# Patient Record
Sex: Female | Born: 2000 | Race: Black or African American | Hispanic: No | Marital: Single | Smoking: Never smoker
Health system: Southern US, Community
[De-identification: ages and names within clinical notes are randomized; demographics above are authoritative.]

## PROBLEM LIST (undated history)

## (undated) DIAGNOSIS — Z789 Other specified health status: Secondary | ICD-10-CM

## (undated) HISTORY — PX: NO PAST SURGERIES: SHX2092

---

## 2020-01-02 NOTE — L&D Delivery Note (Addendum)
OB/GYN Faculty Practice Delivery Note  Carolyn Rivas is a 20 y.o. G2P0010 s/p SVD at [redacted]w[redacted]d. She was admitted for IOL due to pre-eclampsia with severe features.   ROM: 10h 37m with clear fluid GBS Status: Negative Maximum Maternal Temperature: 98.9  Delivery Date/Time: 11/20/2020 at 2331 Delivery: Called to room and patient was complete and pushing with NICU team in the room. Head delivered without difficulty, LOA. No nuchal cord present. Shoulder and body delivered in usual fashion. Infant with spontaneous cry, placed on mother's abdomen, dried and stimulated. Cord clamped x 2 after 1-minute delay, and cut by father of the baby. Cord blood drawn. Placenta delivered spontaneously with gentle cord traction. Fundus firm with massage and Pitocin. Labia, perineum, vagina, and cervix inspected inspected with no lacerations.   Placenta: Intact,; 3VC- sent to pathology Complications: None Lacerations: None EBL: 150 cc Analgesia: Epidural  Infant: Viable  APGARs 7 & 9  1890g  Darral Dash, D.O. [facility], PGY-1 11/20/2020, 11:47 PM

## 2020-05-17 ENCOUNTER — Other Ambulatory Visit: Payer: Self-pay

## 2020-05-17 ENCOUNTER — Inpatient Hospital Stay (HOSPITAL_COMMUNITY)
Admission: EM | Admit: 2020-05-17 | Discharge: 2020-05-18 | Disposition: A | Discharge status: Home / Self Care | Payer: Medicaid Other | Attending: Obstetrics and Gynecology | Admitting: Obstetrics and Gynecology

## 2020-05-17 ENCOUNTER — Encounter (HOSPITAL_COMMUNITY): Payer: Self-pay | Admitting: Emergency Medicine

## 2020-05-17 DIAGNOSIS — N939 Abnormal uterine and vaginal bleeding, unspecified: Secondary | ICD-10-CM

## 2020-05-17 DIAGNOSIS — Z3A08 8 weeks gestation of pregnancy: Secondary | ICD-10-CM | POA: Insufficient documentation

## 2020-05-17 DIAGNOSIS — O209 Hemorrhage in early pregnancy, unspecified: Secondary | ICD-10-CM | POA: Insufficient documentation

## 2020-05-17 DIAGNOSIS — Z3A01 Less than 8 weeks gestation of pregnancy: Secondary | ICD-10-CM | POA: Insufficient documentation

## 2020-05-17 HISTORY — DX: Other specified health status: Z78.9

## 2020-05-17 NOTE — MAU Note (Signed)
Pt presents to MAU c/o dark red/brown vaginal bleeding beginning today.  Pt denies any lower abdominal pain, states that she is unsure how many pads she has soaked through, but states that the bleeding did soak through the shorts she was wearing.  Pt did not observe any clots.  No recent intercourse.  Pt states that she was diagnosed with mononucleosis infection approx. 3 weeks ago.

## 2020-05-17 NOTE — ED Triage Notes (Signed)
Patient reports heavy vaginal bleeding this evening , she is [redacted] weeks pregnant , no prenatal visit , G2P0, denies abdominal cramping or contractions, seen by PA at triage .

## 2020-05-17 NOTE — MAU Provider Note (Signed)
History     CSN: [id number]  Arrival date and time: 05/17/20 2248   None     Chief Complaint  Patient presents with  . [redacted] weeks pregnant /Vaginal Bleeding   HPI   Patient is a g2p0010 at approx 7-8 wks by LMP who presents for vaginal bleeding. She had a positive b-HCG on 4/23 (HCG was 538 per [facility]). LMP in end of March. Vaginal bleeding started at 8pm tonight. Reports large amounts of bright red bleeding, enough to soak through her shorts. She denies any cramping or large clots associated with bleeding. Denies nausea or any abdominal pain. Reports bleeding has slowed down but is still present. Says it is lighter than a period at this time.    OB History    Gravida  2   Para      Term      Preterm      AB  1   Living        SAB      IAB  1   Ectopic      Multiple      Live Births              Past Medical History:  Diagnosis Date  . Medical history non-contributory     Past Surgical History:  Procedure Laterality Date  . NO PAST SURGERIES      No family history on file.  Social History   Tobacco Use  . Smoking status: Never Smoker  . Smokeless tobacco: Never Used  Substance Use Topics  . Alcohol use: Never  . Drug use: Never    Allergies: No Known Allergies  Medications Prior to Admission  Medication Sig Dispense Refill Last Dose  . acetaminophen (TYLENOL) 325 MG tablet Take 650 mg by mouth every 6 (six) hours as needed.   Past Month at Unknown time    Review of Systems  Constitutional: Negative for activity change, appetite change and chills.  Respiratory: Negative for chest tightness and shortness of breath.   Neurological: Negative for dizziness and facial asymmetry.   Physical Exam   Blood pressure 122/82, pulse (!) 105, temperature 98.9 F (37.2 C), temperature source Oral, resp. rate 16, last menstrual period 03/26/2020, SpO2 100 %.  Physical Exam Vitals and nursing note reviewed.  Constitutional:      Appearance:  Normal appearance.  Cardiovascular:     Rate and Rhythm: Normal rate.  Pulmonary:     Effort: Pulmonary effort is normal.  Abdominal:     General: Bowel sounds are normal.     Palpations: Abdomen is soft.     Tenderness: There is no abdominal tenderness.  Skin:    General: Skin is warm and dry.  Neurological:     General: No focal deficit present.     Mental Status: She is alert.  Psychiatric:        Mood and Affect: Mood normal.        Behavior: Behavior normal.     MAU Course  Procedures  MDM Pt evaluated at bedside  Ectopic workup - CBC, ABO, HCG, [place]  CBC notable for hgb 10.4  US shows viable IUP at [redacted]w[redacted]d, moderate subchorionic hemorrhage Rh Pos, rhogam not indicated  Assessment and Plan   Viable IUP w subchorionic hemorrhage -discussed w patient and discussed etiology of subchorionic hemorrhage -patient lives in [place], [place], plans to find prenatal care in that area.  -discussed pelvic rest until bleeding resolves, counseled on return precautions -script sent  for prenatal vitamin  Gita Kudo 05/17/2020, 11:44 PM

## 2020-05-17 NOTE — ED Provider Notes (Signed)
Emergency Medicine Provider OB Triage Evaluation Note  Carolyn Rivas is a 20 y.o. female, G2P0A1, at Unknown gestation who presents to the emergency department with complaints of vaginal bleeding that began around 9PM  Review of  Systems  Positive: vaginal bleeding Negative: abdominal pain, syncope, chest pain, dyspnea  Physical Exam  BP 122/82 (BP Location: Right Arm)   Pulse (!) 105   Temp 98.9 F (37.2 C) (Oral)   Resp 16   LMP 03/26/2020   SpO2 100%    General: Awake, no distress  HEENT: Atraumatic  Resp: Normal effort  Cardiac: Mild tachycardia Abd: Nondistended, nontender  MSK: Moves all extremities without difficulty Neuro: Speech clear  Medical Decision Making  Pt evaluated for pregnancy concern and is stable for transfer to MAU. Pt is in agreement with plan for transfer.  Chart reviewed- positive pregnancy test in care everywhere with [facility].   10:57 PM Discussed with MAU attending who accepts patient in transfer.  Clinical Impression   1. Bleeding in early pregnancy        Cherly Anderson, PA-C 05/17/20 2130    Rozelle Logan, DO 05/17/20 2337

## 2020-05-18 ENCOUNTER — Inpatient Hospital Stay (HOSPITAL_COMMUNITY): Payer: Medicaid Other

## 2020-05-18 DIAGNOSIS — Z3A01 Less than 8 weeks gestation of pregnancy: Secondary | ICD-10-CM | POA: Diagnosis not present

## 2020-05-18 DIAGNOSIS — O209 Hemorrhage in early pregnancy, unspecified: Secondary | ICD-10-CM

## 2020-05-18 DIAGNOSIS — Z3A08 8 weeks gestation of pregnancy: Secondary | ICD-10-CM | POA: Diagnosis not present

## 2020-05-18 LAB — CBC
HCT: 32.2 % — ABNORMAL LOW (ref 36.0–46.0)
Hemoglobin: 10.4 g/dL — ABNORMAL LOW (ref 12.0–15.0)
MCH: 28.4 pg (ref 26.0–34.0)
MCHC: 32.3 g/dL (ref 30.0–36.0)
MCV: 88 fL (ref 80.0–100.0)
Platelets: 279 10*3/uL (ref 150–400)
RBC: 3.66 MIL/uL — ABNORMAL LOW (ref 3.87–5.11)
RDW: 13.7 % (ref 11.5–15.5)
WBC: 8.2 10*3/uL (ref 4.0–10.5)
nRBC: 0 % (ref 0.0–0.2)

## 2020-05-18 LAB — HCG, QUANTITATIVE, PREGNANCY: hCG, Beta Chain, Quant, S: 48201 m[IU]/mL — ABNORMAL HIGH (ref <5)

## 2020-05-18 LAB — ABO/RH: ABO/RH(D): O POS

## 2020-05-18 MED ORDER — PREPLUS 27-1 MG PO TABS: 1.0000 | ORAL_TABLET | Freq: Every day | ORAL | 13 refills | Status: DC

## 2020-05-18 NOTE — Discharge Instructions (Signed)
Subchorionic Hematoma  A hematoma is a collection of blood outside of the blood vessels. A subchorionic hematoma is a collection of blood between the outer wall of the embryo (chorion) and the inner wall of the uterus. This condition can cause vaginal bleeding. Early small hematomas usually shrink on their own and do not affect your baby or pregnancy. When bleeding starts later in pregnancy, or if the hematoma is larger or occurs in older pregnant women, the condition may be more serious. Larger hematomas increase the chances of miscarriage. This condition also increases the risk of:  Premature separation of the placenta from the uterus.  Premature (preterm) labor.  Stillbirth. What are the causes? The exact cause of this condition is not known. It occurs when blood is trapped between the placenta and the uterine wall because the placenta has separated from the original site of implantation. What increases the risk? You are more likely to develop this condition if:  You were treated with fertility medicines.  You became pregnant through in vitro fertilization (IVF). What are the signs or symptoms? Symptoms of this condition include:  Vaginal spotting or bleeding.  Abdominal pain. This is rare. Sometimes you may have no symptoms and the bleeding may only be seen when ultrasound images are taken (transvaginal ultrasound). How is this diagnosed? This condition is diagnosed based on a physical exam. This includes a pelvic exam. You may also have other tests, including:  Blood tests.  Urine tests.  Ultrasound of the abdomen. How is this treated? Treatment for this condition can vary. Treatment may include:  Watchful waiting. You will be monitored closely for any changes in bleeding.  Medicines.  Activity restriction. This may be needed until the bleeding stops.  A medicine called Rh immunoglobulin. This is given if you have an Rh-negative blood type. It prevents Rh  sensitization. Follow these instructions at home:  Stay on bed rest if told to do so by your health care provider.  Do not lift anything that is heavier than 10 lb (4.5 kg), or the limit that you are told by your health care provider.  Track and write down the number of pads you use each day and how soaked (saturated) they are.  Do not use tampons.  Keep all follow-up visits. This is important. Your health care provider may ask you to have follow-up blood tests or ultrasound tests or both. Contact a health care provider if:  You have any vaginal bleeding.  You have a fever. Get help right away if:  You have severe cramps in your stomach, back, abdomen, or pelvis.  You pass large clots or tissue. Save any tissue for your health care provider to look at.  You faint.  You become light-headed or weak. Summary  A subchorionic hematoma is a collection of blood between the outer wall of the embryo (chorion) and the inner wall of the uterus.  This condition can cause vaginal bleeding.  Sometimes you may have no symptoms and the bleeding may only be seen when ultrasound images are taken.  Treatment may include watchful waiting, medicines, or activity restriction.  Keep all follow-up visits. Get help right away if you have severe cramps or heavy vaginal bleeding. This information is not intended to replace advice given to you by your health care provider. Make sure you discuss any questions you have with your health care provider. Document Revised: 09/14/2019 Document Reviewed: 09/14/2019 Elsevier Patient Education  2021 Elsevier Inc.   

## 2020-06-19 ENCOUNTER — Inpatient Hospital Stay (HOSPITAL_COMMUNITY)
Admission: AD | Admit: 2020-06-19 | Discharge: 2020-06-19 | Disposition: A | Discharge status: Home / Self Care | Payer: Medicaid Other | Attending: Family Medicine | Admitting: Family Medicine

## 2020-06-19 ENCOUNTER — Other Ambulatory Visit: Payer: Self-pay

## 2020-06-19 ENCOUNTER — Encounter (HOSPITAL_COMMUNITY): Payer: Self-pay | Admitting: Family Medicine

## 2020-06-19 DIAGNOSIS — Z3492 Encounter for supervision of normal pregnancy, unspecified, second trimester: Secondary | ICD-10-CM

## 2020-06-19 DIAGNOSIS — Z79899 Other long term (current) drug therapy: Secondary | ICD-10-CM | POA: Insufficient documentation

## 2020-06-19 DIAGNOSIS — Z3A12 12 weeks gestation of pregnancy: Secondary | ICD-10-CM | POA: Insufficient documentation

## 2020-06-19 DIAGNOSIS — O26891 Other specified pregnancy related conditions, first trimester: Secondary | ICD-10-CM | POA: Insufficient documentation

## 2020-06-19 DIAGNOSIS — Z3689 Encounter for other specified antenatal screening: Secondary | ICD-10-CM

## 2020-06-19 DIAGNOSIS — R519 Headache, unspecified: Secondary | ICD-10-CM | POA: Diagnosis not present

## 2020-06-19 LAB — URINALYSIS, ROUTINE W REFLEX MICROSCOPIC
Bilirubin Urine: NEGATIVE
Glucose, UA: NEGATIVE mg/dL
Hgb urine dipstick: NEGATIVE
Ketones, ur: NEGATIVE mg/dL
Leukocytes,Ua: NEGATIVE
Nitrite: NEGATIVE
Protein, ur: NEGATIVE mg/dL
Specific Gravity, Urine: >1.03 — ABNORMAL HIGH (ref 1.005–1.030)
pH: 6 (ref 5.0–8.0)

## 2020-06-19 NOTE — MAU Note (Signed)
Pt is G2P0 at 12.1 weeks. Pt reports she has not received prenatal care and has been unable to get in with a provider. Requesting ultrasound for gender and pregnancy confirmation. 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

## 2020-06-19 NOTE — MAU Provider Note (Signed)
History     CSN: [id number]  Arrival date and time: 06/19/20 1746   Event Date/Time   First Provider Initiated Contact with Patient 06/19/20 1820      Chief Complaint  Patient presents with   Headache   HPI Carolyn Rivas is a 20 y.o. G2P0010 at [redacted]w[redacted]d who presents stating she had a headache at home. She does not have a headache now. She took tylenol and it helped but she was concerned about it causing autism. She denies any pain, vaginal bleeding or discharge. She reports she is here to find out the gender of the baby for her gender reveal next week.   OB History     Gravida  2   Para      Term      Preterm      AB  1   Living         SAB      IAB  1   Ectopic      Multiple      Live Births              Past Medical History:  Diagnosis Date   Medical history non-contributory     Past Surgical History:  Procedure Laterality Date   NO PAST SURGERIES      History reviewed. No pertinent family history.  Social History   Tobacco Use   Smoking status: Never   Smokeless tobacco: Never  Substance Use Topics   Alcohol use: Never   Drug use: Never    Allergies: No Known Allergies  Medications Prior to Admission  Medication Sig Dispense Refill Last Dose   acetaminophen (TYLENOL) 325 MG tablet Take 650 mg by mouth every 6 (six) hours as needed.      Prenatal Vit-Fe Fumarate-FA (PREPLUS) 27-1 MG TABS Take 1 tablet by mouth daily. 30 tablet 13     Review of Systems  Constitutional: Negative.  Negative for fatigue and fever.  HENT: Negative.    Respiratory: Negative.  Negative for shortness of breath.   Cardiovascular: Negative.  Negative for chest pain.  Gastrointestinal: Negative.  Negative for abdominal pain, constipation, diarrhea, nausea and vomiting.  Genitourinary: Negative.  Negative for dysuria, vaginal bleeding and vaginal discharge.  Neurological: Negative.  Negative for dizziness and headaches.  Physical Exam   Blood  pressure 113/62, pulse 96, temperature 98.6 F (37 C), temperature source Oral, resp. rate 16, height 5\' 1"  (1.549 m), weight 53.7 kg, last menstrual period 03/26/2020, SpO2 100 %.  Physical Exam Vitals and nursing note reviewed.  Constitutional:      General: She is not in acute distress.    Appearance: She is well-developed.  HENT:     Head: Normocephalic.  Eyes:     Pupils: Pupils are equal, round, and reactive to light.  Cardiovascular:     Rate and Rhythm: Normal rate and regular rhythm.     Heart sounds: Normal heart sounds.  Pulmonary:     Effort: Pulmonary effort is normal. No respiratory distress.     Breath sounds: Normal breath sounds.  Abdominal:     General: Bowel sounds are normal. There is no distension.     Palpations: Abdomen is soft.     Tenderness: There is no abdominal tenderness.  Skin:    General: Skin is warm and dry.  Neurological:     Mental Status: She is alert and oriented to person, place, and time.  Psychiatric:  Mood and Affect: Mood normal.        Behavior: Behavior normal.        Thought Content: Thought content normal.        Judgment: Judgment normal.   FHT: 160 bpm  MAU Course  Procedures Results for orders placed or performed during the hospital encounter of 06/19/20 (from the past 24 hour(s))  Urinalysis, Routine w reflex microscopic Urine, Clean Catch     Status: Abnormal   Collection Time: 06/19/20  6:07 PM  Result Value Ref Range   Color, Urine YELLOW YELLOW   APPearance CLEAR CLEAR   Specific Gravity, Urine >1.030 (H) 1.005 - 1.030   pH 6.0 5.0 - 8.0   Glucose, UA NEGATIVE NEGATIVE mg/dL   Hgb urine dipstick NEGATIVE NEGATIVE   Bilirubin Urine NEGATIVE NEGATIVE   Ketones, ur NEGATIVE NEGATIVE mg/dL   Protein, ur NEGATIVE NEGATIVE mg/dL   Nitrite NEGATIVE NEGATIVE   Leukocytes,Ua NEGATIVE NEGATIVE    MDM UA  Reassurance of safety of tylenol discussed with patient. Discussed prenatal care and timing of being able to  find out the gender. Discussed importance of proper hydration and nutrition in pregnancy.   Assessment and Plan   1. Presence of fetal heart sounds in second trimester   2. [redacted] weeks gestation of pregnancy    -Discharge home in stable condition -Second trimester precautions discussed -Patient advised to follow-up with OB to establish prenatal care, list given -Patient may return to MAU as needed or if her condition were to change or worsen   Rolm Bookbinder CNM 06/19/2020, 6:20 PM

## 2020-06-19 NOTE — Discharge Instructions (Signed)
Prenatal Care Providers           [facility]  [address] [phone]  [facility]   [address]  [phone]  [facility] [place]       [address] [phone]            [facility] @ [place]     [address] [phone]          [facility] [place]   [address] [phone]  [facility]  [address] [phone]     [facility]  [address]   [phone]     [facility]  Phone: [phone]  [facility]  Phone: [phone]  [facility] Phone: [phone]  [facility] Phone: [phone]  [facility] Phone: [phone]  [facility] Phone: [phone]  [facility]  Phone: [phone] Safe Medications in Pregnancy   Acne: Benzoyl Peroxide Salicylic Acid  Backache/Headache: Tylenol: 2 regular strength every 4 hours OR              2 Extra strength every 6 hours  Colds/Coughs/Allergies: Benadryl (alcohol free) 25 mg every 6 hours as needed Breath right strips Claritin Cepacol throat lozenges Chloraseptic throat spray Cold-Eeze- up to three times per day Cough drops, alcohol free Flonase (by prescription only) Guaifenesin Mucinex Robitussin DM (plain only, alcohol free) Saline nasal spray/drops Sudafed (pseudoephedrine) & Actifed ** use only after [redacted] weeks gestation and if you do not have high blood pressure Tylenol Vicks Vaporub Zinc lozenges Zyrtec   Constipation: Colace Ducolax suppositories Fleet enema Glycerin suppositories Metamucil Milk of magnesia Miralax Senokot Smooth move tea  Diarrhea: Kaopectate Imodium A-D  *NO pepto  Bismol  Hemorrhoids: Anusol Anusol HC Preparation H Tucks  Indigestion: Tums Maalox Mylanta Zantac  Pepcid  Insomnia: Benadryl (alcohol free) 25mg every 6 hours as needed Tylenol PM Unisom, no Gelcaps  Leg Cramps: Tums MagGel  Nausea/Vomiting:  Bonine Dramamine Emetrol Ginger extract Sea bands Meclizine  Nausea medication to take during pregnancy:  Unisom (doxylamine succinate 25 mg tablets) Take one tablet daily at bedtime. If symptoms are not adequately controlled, the dose can be increased to a maximum recommended dose of two tablets daily (1/2 tablet in the morning, 1/2 tablet mid-afternoon and one at bedtime). Vitamin B6 100mg tablets. Take one tablet twice a day (up to 200 mg per day).  Skin Rashes: Aveeno products Benadryl cream or 25mg every 6 hours as needed Calamine Lotion 1% cortisone cream  Yeast infection: Gyne-lotrimin 7 Monistat 7   **If taking multiple medications, please check labels to avoid duplicating the same active ingredients **take medication as directed on the label ** Do not exceed 4000 mg of tylenol in 24 hours **Do not take medications that contain aspirin or ibuprofen    

## 2020-06-29 ENCOUNTER — Ambulatory Visit (INDEPENDENT_AMBULATORY_CARE_PROVIDER_SITE_OTHER): Payer: Medicaid Other | Admitting: *Deleted

## 2020-06-29 ENCOUNTER — Telehealth: Payer: Self-pay | Admitting: *Deleted

## 2020-06-29 ENCOUNTER — Other Ambulatory Visit: Payer: Self-pay

## 2020-06-29 VITALS — BP 100/62 | HR 82 | Temp 99.0°F | Wt 118.8 lb

## 2020-06-29 DIAGNOSIS — Z3480 Encounter for supervision of other normal pregnancy, unspecified trimester: Secondary | ICD-10-CM

## 2020-06-29 MED ORDER — BLOOD PRESSURE MONITOR AUTOMAT DEVI: 1.0000 | Freq: Every day | 0 refills | Status: DC

## 2020-06-29 MED ORDER — GOJJI WEIGHT SCALE MISC: 1.0000 | Freq: Every day | 0 refills | Status: DC | PRN

## 2020-06-29 NOTE — Progress Notes (Signed)
   Location: [facility] Patient: clinic Provider: clinic  PRENATAL INTAKE SUMMARY  Ms. Belknap presents today New OB Nurse Interview.  OB History     Gravida  2   Para      Term      Preterm      AB  1   Living         SAB      IAB  1   Ectopic      Multiple      Live Births             I have reviewed the patient's medical, obstetrical, social, and family histories, medications, and available lab results.  SUBJECTIVE She has no unusual complaints.  OBJECTIVE Initial Nurse interview for history/labs (New OB)  EDD: 12/31/2020 GA: [redacted]w[redacted]d G2P0010 FHT: 159  GENERAL APPEARANCE: alert, well appearing, in no apparent distress, oriented to person, place and time   ASSESSMENT Normal pregnancy Patient was getting labs completed. Lab tech called nurse to come to lab area due to patient "passing out". Patient was taken to room 3, laid on exam table, blood pressures taken and feet elevated about heart level. Patient was given ice pack to place on her check, patient reported being and sweaty. Patient laid on exam table for about 10-15 minutes. Patient reported she felt better and was ready to go.   PLAN Prenatal care:  [facility] Pnl/HIV/Hep C OB Urine Culture GC/CT-next visit with Raelyn Mora, CNM 76/7/22 HgbEval/SMA/CF ([facility]) [facility] A1C Rx blood pressure monitor and weight scale sent to [facility] Patient to sign up for Babyscripts Start PNV  Follow Up Instructions:   I discussed the assessment and treatment plan with the patient. The patient was provided an opportunity to ask questions and all were answered. The patient agreed with the plan and demonstrated an understanding of the instructions.   The patient was advised to call back or seek an in-person evaluation if the symptoms worsen or if the condition fails to improve as anticipated.  I provided 40 minutes of  face-to-face time during this encounter.  Clovis Pu,  RN

## 2020-06-29 NOTE — Telephone Encounter (Signed)
Patient returned call. Appointment changed to this afternoon.  Clovis Pu, RN

## 2020-06-29 NOTE — Telephone Encounter (Signed)
Spoke with patient regarding appointment on 07/05/20. Appointment needs to be rescheduled due to the nurse will be out of the office. Patient will call back if she can be seen this afternoon.  Clovis Pu, RN

## 2020-06-29 NOTE — Patient Instructions (Signed)
   Genetic Screening Results Information: You are having genetic testing called Panor[facility] today.  It will take approximately 2 weeks before the results are available.  To get your results, you need Internet access to a web browser to search [place]/MyChart (the direct app on your phone will not give you these results).  Then select Lab Scanned and click on the blue hyper link that says View Image to see your Panorama results.  You can also use the directions on the purple card given to look up your results directly on the [facility] website.  

## 2020-06-30 LAB — OBSTETRIC PANEL, INCLUDING HIV
Antibody Screen: NEGATIVE
HIV Screen 4th Generation wRfx: NONREACTIVE
Hepatitis B Surface Ag: NEGATIVE
RPR Ser Ql: NONREACTIVE
Rh Factor: POSITIVE
Rubella Antibodies, IGG: 2.13 index (ref >0.99)

## 2020-06-30 LAB — HEMOGLOBIN A1C
Est. average glucose Bld gHb Est-mCnc: 85 mg/dL
Hgb A1c MFr Bld: 4.6 % — ABNORMAL LOW (ref 4.8–5.6)

## 2020-06-30 LAB — HEPATITIS C ANTIBODY: Hep C Virus Ab: 0.1 s/co ratio (ref 0.0–0.9)

## 2020-07-01 LAB — URINE CULTURE, OB REFLEX

## 2020-07-01 LAB — CULTURE, OB URINE

## 2020-07-07 ENCOUNTER — Encounter: Payer: Medicaid Other | Admitting: Obstetrics and Gynecology

## 2020-07-21 ENCOUNTER — Encounter: Payer: Self-pay | Admitting: Obstetrics and Gynecology

## 2020-07-21 ENCOUNTER — Encounter: Payer: Medicaid Other | Admitting: Obstetrics and Gynecology

## 2020-07-21 DIAGNOSIS — O418X10 Other specified disorders of amniotic fluid and membranes, first trimester, not applicable or unspecified: Secondary | ICD-10-CM

## 2020-07-21 DIAGNOSIS — Z3A16 16 weeks gestation of pregnancy: Secondary | ICD-10-CM

## 2020-07-21 DIAGNOSIS — Z3480 Encounter for supervision of other normal pregnancy, unspecified trimester: Secondary | ICD-10-CM

## 2020-07-29 MED ORDER — HEART RATE MONITOR MISC: 1.0000 | 0 refills | Status: DC | PRN

## 2020-08-05 ENCOUNTER — Encounter: Payer: Medicaid Other | Admitting: Certified Nurse Midwife

## 2020-09-16 ENCOUNTER — Inpatient Hospital Stay (HOSPITAL_COMMUNITY)
Admission: AD | Admit: 2020-09-16 | Discharge: 2020-09-16 | Disposition: A | Discharge status: Home / Self Care | Payer: Medicaid Other | Attending: Obstetrics and Gynecology | Admitting: Obstetrics and Gynecology

## 2020-09-16 DIAGNOSIS — Z3A24 24 weeks gestation of pregnancy: Secondary | ICD-10-CM | POA: Diagnosis not present

## 2020-09-16 DIAGNOSIS — Z9189 Other specified personal risk factors, not elsewhere classified: Secondary | ICD-10-CM | POA: Insufficient documentation

## 2020-09-16 DIAGNOSIS — O0932 Supervision of pregnancy with insufficient antenatal care, second trimester: Secondary | ICD-10-CM | POA: Insufficient documentation

## 2020-09-16 DIAGNOSIS — Z5989 Other problems related to housing and economic circumstances: Secondary | ICD-10-CM | POA: Diagnosis not present

## 2020-09-16 DIAGNOSIS — Z5982 Transportation insecurity: Secondary | ICD-10-CM

## 2020-09-16 NOTE — MAU Provider Note (Signed)
Event Date/Time   First Provider Initiated Contact with Patient 09/16/20 1500      [name] is a 20 y.o. G2P0010 patient who presents to MAU today with complaint of no prenatal care. Patient started care but missed some appointments & was dismissed from the practice. Reports she hasn't been able to establish care since then due to medicaid & advanced gestation. Missed appointments due to transportation issues. She does not drive & relies on other people for rides - was no aware of Medicaid of [facility] transportation services for medical appointments.  Denies abdominal pain or vaginal bleeding.   O BP 117/74 (BP Location: Right Arm)   Pulse (!) 106   Temp 98.9 F (37.2 C) (Oral)   Resp 17   Ht 5\' 1"  (1.549 m)   Wt 62.2 kg   LMP 03/26/2020   SpO2 99%   BMI 25.92 kg/m  Physical Exam Vitals and nursing note reviewed.  Constitutional:      Appearance: Normal appearance.  HENT:     Head: Normocephalic and atraumatic.  Eyes:     General: No scleral icterus. Pulmonary:     Effort: Pulmonary effort is normal. No respiratory distress.  Neurological:     Mental Status: She is alert.  Psychiatric:        Attention and Perception: Attention normal.        Mood and Affect: Affect is tearful.        Speech: Speech normal.        Thought Content: Thought content normal.    A/P Medical screening exam complete 1. No prenatal care in current pregnancy in second trimester  -patient dismissed from [facility] after 3 no show visits. Reviewed with 03/28/2020 who approves patient going to [facility]. Message sent to office to establish care.  -anatomy scan ordered -reviewed transportation services with patients  2. Lack of access to transportation   3. [redacted] weeks gestation of pregnancy      [facility], NP 09/16/2020 6:22 PM

## 2020-09-16 NOTE — Discharge Instructions (Signed)
[facility] [phone]

## 2020-09-16 NOTE — MAU Note (Signed)
Is 25wks preg.  Has not had a check up since her first trimester, missed an appt because she didn't have a ride, now they won't see her. No one else was see her at this point because she has medicaid. Hasn't had an [place], wants an [place]. Wants a check up. No pain or bleeding.  Unsure if feeling baby move.

## 2020-09-27 ENCOUNTER — Other Ambulatory Visit: Payer: Self-pay

## 2020-09-27 ENCOUNTER — Telehealth: Payer: Self-pay

## 2020-09-27 ENCOUNTER — Other Ambulatory Visit: Payer: Self-pay | Admitting: *Deleted

## 2020-09-27 ENCOUNTER — Ambulatory Visit: Payer: Medicaid Other | Attending: Student

## 2020-09-27 DIAGNOSIS — Z9189 Other specified personal risk factors, not elsewhere classified: Secondary | ICD-10-CM

## 2020-09-27 DIAGNOSIS — Z3A24 24 weeks gestation of pregnancy: Secondary | ICD-10-CM | POA: Diagnosis present

## 2020-09-27 DIAGNOSIS — D563 Thalassemia minor: Secondary | ICD-10-CM

## 2020-09-27 DIAGNOSIS — O0932 Supervision of pregnancy with insufficient antenatal care, second trimester: Secondary | ICD-10-CM

## 2020-09-27 DIAGNOSIS — Z5982 Transportation insecurity: Secondary | ICD-10-CM

## 2020-09-27 DIAGNOSIS — O09892 Supervision of other high risk pregnancies, second trimester: Secondary | ICD-10-CM

## 2020-09-27 NOTE — Telephone Encounter (Signed)
Message left requesting call back to review results from today and to discuss upcoming appointments. Will send myChart message as well.   Rolm Bookbinder, CNM 09/27/20 4:01 PM

## 2020-09-28 ENCOUNTER — Telehealth: Payer: Self-pay

## 2020-09-28 NOTE — Telephone Encounter (Signed)
Message left to review ultrasound results and upcoming appointments. Attempt #2 to contact patient.   Rolm Bookbinder, CNM 09/28/20 1:09 PM

## 2020-10-04 ENCOUNTER — Ambulatory Visit: Payer: Medicaid Other | Attending: Obstetrics

## 2020-10-04 ENCOUNTER — Other Ambulatory Visit: Payer: Self-pay

## 2020-10-04 DIAGNOSIS — Z148 Genetic carrier of other disease: Secondary | ICD-10-CM | POA: Diagnosis not present

## 2020-10-05 NOTE — Progress Notes (Addendum)
Name: Carolyn Rivas [facility] Indication: Discuss carrier screening results  DOB: [birth year] Age: 20 y.o.   EDC:  12/31/2020 LMP: 03/26/2020 Referring Provider:  Judeth Rivas, CNM  EGA: [id number] Genetic Counselor: Carolyn Dunk, MS, CGC  OB Hx: G2P0010 Date of Appointment: 10/04/2020  Accompanied by: Father of the current pregnancy Carolyn Rivas MRN [id number]). Face to Face Time: 30 Minutes   Previous Testing Completed: Yadira previously completed Non-Invasive Prenatal Screening (NIPS) in this pregnancy (scanned into Epic under the Media tab). The result is low risk. This screening significantly reduces the risk that the current pregnancy has Down syndrome, Trisomy 73, Trisomy 13, Monosomy X, and Triploidy. [name] previously completed [facility] Carrier Screening in this pregnancy (scanned into Epic under the Media tab). 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 See genetic counseling portion of note below. She screened to not be a carrier for Spinal Muscular Atrophy (SMA) and beta hemoglobinopathies. A negative result on carrier screening reduces the likelihood of being a carrier, however, does not entirely rule out the possibility.   Medical History:  This is Carolyn Rivas's 2nd pregnancy. She has had one early elective loss. Reports some bleeding in the beginning of the current pregnancy. This bleeding has resolved.  Denies personal history of diabetes, high blood pressure, thyroid conditions, and seizures. Denies infections and fevers in this pregnancy. Denies using tobacco, alcohol, or street drugs in this pregnancy.   Family History: A pedigree was created and scanned into Epic under the Media tab. Carolyn Rivas reports a maternal half brother with a disability and seizures. She does not have more information about this individual, the kind of disability he has, the etiology of his disability, or the type and etiology of his seizures. Family  history not remarkable for consanguinity, individuals with birth defects, multiple spontaneous abortions, still births, or unexplained neonatal death.     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 Positive for the pathogenic alpha 3.7 deletion of the HBA2 gene. With Carolyn Rivas's alpha thalassemia screening result, we know that she has three working copies of the alpha-globin genes while the 4th alpha-globin gene is deleted. Each of Carolyn Rivas's children will either inherit two functional copies or one functional copy with one deletion from her. Carolyn Rivas is not at an increased risk to have a baby with fetal hydrops due to Hemoglobin Barts disease (--/--) regardless of her partner's carrier status. We discussed there would be a 25% risk for the current pregnancy to be affected with Hemoglobin H disease (--/?-) if Carolyn Rivas is found to be an alpha thalassemia carrier in the cis configuration (??/--). Clinical features of Hemoglobin H disease are highly variable and generally develop in the first years of life. The primary symptoms include moderate anemia with marked microcytosis, jaundice, and hepatosplenomegaly. Some affected individuals do not require blood transfusions while others may require occasional blood transfusions throughout their lifetime. Because Carolyn Rivas is a silent carrier for alpha thalassemia (??/?-), carrier screening for her reproductive partner is recommended to determine risk for the current pregnancy. We reviewed with Carolyn Rivas that if her partner is found to be a carrier for alpha thalassemia, given his African American ancestry, it is more likely for him to be a silent carrier (??/?-) or a carrier in the trans configuration (?-/?-) as the cis configuration (??/--) has been reported very rarely in individuals with African American ancestry. If Carolyn Rivas is a silent carrier (??/?-) or  a carrier in the trans configuration (?-/?-)  there would not be an increased risk for the pregnancy to have Hemoglobin H disease.   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 CF is an autosomal recessive condition caused by mutations in the gene CFTR. CF is a multisystem disease that involves the respiratory tract, exocrine pancreas, intestine, hepatobiliary system, female genital tract, and exocrine sweat glands. Morbidities include progressive obstructive lung disease with bronchiectasis, frequent hospitalizations for pulmonary disease, pancreatic insufficiency and malnutrition, recurrent sinusitis and bronchitis, and female infertility. The overall median predicted survival is estimated at 40.7 years. CF has not been shown to be associated with intellectual disability. The CF carrier frequency is approximately 1/24 in Ashkenazi Jewish populations, 1/25 in Non-Hispanic Caucasian populations, 1/58 in Hispanic populations, 1/61 in [place] American populations, and 1/94 in [place] American populations. Because Carolyn Rivas is a known carrier for CF, carrier screening for her reproductive partner is recommended to determine risk for the current pregnancy. We reviewed with Carolyn Rivas that if her partner is found to also be a carrier for CF, there would be a 25% chance the current pregnancy and all future pregnancies would be affected.    Testing/Screening Options:   Amniocentesis. This procedure is available for prenatal diagnosis. Possible procedural difficulties and complications that can arise include maternal infection, cramping, bleeding, fluid leakage, and/or pregnancy loss. The risk for pregnancy loss with an amniocentesis is 1/500. Per the [employer] of [employer]st[employer] Gynecologists (ACOG) Practice Bulletin 162, all pregnant women should be offered prenatal assessment for aneuploidy by diagnostic testing regardless of maternal age or other risk factors. If indicated, genetic testing that could be ordered on an amniocentesis  sample includes a fetal karyotype, fetal microarray, and testing for specific syndromes such as CF and Alpha Thalassemia.   Carrier Screening. Per the ACOG Committee Opinion 691, if an individual is found to be a carrier for a specific condition, the individual's reproductive partner should be offered testing in order to receive informed genetic counseling about potential reproductive outcomes. Genetic counseling offered Carolyn Rivas carrier screening for CF and Alpha Thalassemia given Rosslyn's carrier status.    Patient Plan:  Proceed with: Carrier screening for Christian for CF and Alpha Thalassemia. Christian applied for [employer] Care program during today's appointment. Informed consent was obtained. All questions were answered.  Declined: Amniocentesis   Results:  Carrier Screening for Christian for CF & Alpha Thalassemia - screen negative Results from the above testing will be available here once all testing is completed.   Thank you for sharing in the care of Korra with [place].  Please do not hesitate to contact us if you have any questions.  Carolyn Dunk, MS, [facility]

## 2020-10-13 ENCOUNTER — Other Ambulatory Visit: Payer: Self-pay

## 2020-10-13 ENCOUNTER — Ambulatory Visit (INDEPENDENT_AMBULATORY_CARE_PROVIDER_SITE_OTHER): Payer: Medicaid Other | Admitting: Obstetrics & Gynecology

## 2020-10-13 VITALS — BP 128/82 | HR 114 | Wt 151.4 lb

## 2020-10-13 DIAGNOSIS — Z3480 Encounter for supervision of other normal pregnancy, unspecified trimester: Secondary | ICD-10-CM

## 2020-10-13 DIAGNOSIS — O0933 Supervision of pregnancy with insufficient antenatal care, third trimester: Secondary | ICD-10-CM

## 2020-10-13 DIAGNOSIS — O0930 Supervision of pregnancy with insufficient antenatal care, unspecified trimester: Secondary | ICD-10-CM | POA: Insufficient documentation

## 2020-10-13 NOTE — Progress Notes (Signed)
  Subjective: insufficient care, dismissed from [facility] [name] is a G2P0010 [redacted]w[redacted]d being seen today for her first obstetrical visit.  Her obstetrical history is significant for non-compliance. Patient does intend to breast feed. Pregnancy history fully reviewed.  Patient reports  only eats junk food .  Vitals:   10/13/20 1605  BP: 128/82  Pulse: (!) 114  Weight: 151 lb 6.4 oz (68.7 kg)    HISTORY: OB History  Gravida Para Term Preterm AB Living  2       1    SAB IAB Ectopic Multiple Live Births    1          # Outcome Date GA Lbr Len/2nd Weight Sex Delivery Anes PTL Lv  2 Current           1 IAB            Past Medical History:  Diagnosis Date   Medical history non-contributory    Past Surgical History:  Procedure Laterality Date   NO PAST SURGERIES     No family history on file.   Exam    Uterus:   28 cm  Pelvic Exam: deferred   Perineum:    Vulva:    Vagina:     pH:    Cervix:    Adnexa:    Bony Pelvis:   System: Breast:  normal appearance, no masses or tenderness   Skin: normal coloration and turgor, no rashes    Neurologic: oriented, normal mood   Extremities: normal strength, tone, and muscle mass   HEENT PERRLA   Mouth/Teeth mucous membranes moist, pharynx normal without lesions   Neck supple   Cardiovascular: regular rate and rhythm, no murmurs or gallops   Respiratory:  appears well, vitals normal, no respiratory distress, acyanotic, normal RR, neck free of mass or lymphadenopathy, chest clear, no wheezing, crepitations, rhonchi, normal symmetric air entry   Abdomen: gravid   Urinary:       Assessment:    Pregnancy: G2P0010 Patient Active Problem List   Diagnosis Date Noted   Prenatal care insufficient 10/13/2020   Supervision of other normal pregnancy, antepartum 06/29/2020        Plan:     Initial labs drawn. Prenatal vitamins. Problem list reviewed and updated. Genetic Screening discussed : results  reviewed.  Ultrasound discussed; fetal survey: results reviewed.  Follow up in 2 weeks. 50% of 30 min visit spent on counseling and coordination of care.  Nutrition consult   Carolyn Rivas 10/13/2020

## 2020-10-13 NOTE — Progress Notes (Signed)
Patient stated that her hips would become "numb at night"

## 2020-10-17 ENCOUNTER — Ambulatory Visit: Payer: Self-pay

## 2020-10-18 ENCOUNTER — Other Ambulatory Visit: Payer: Self-pay

## 2020-10-18 ENCOUNTER — Other Ambulatory Visit: Payer: Medicaid Other

## 2020-10-18 DIAGNOSIS — Z3480 Encounter for supervision of other normal pregnancy, unspecified trimester: Secondary | ICD-10-CM

## 2020-10-19 LAB — CBC
Hematocrit: 33.4 % — ABNORMAL LOW (ref 34.0–46.6)
Hemoglobin: 11 g/dL — ABNORMAL LOW (ref 11.1–15.9)
MCH: 28.1 pg (ref 26.6–33.0)
MCHC: 32.9 g/dL (ref 31.5–35.7)
MCV: 85 fL (ref 79–97)
Platelets: 298 10*3/uL (ref 150–450)
RBC: 3.91 x10E6/uL (ref 3.77–5.28)
RDW: 12.8 % (ref 11.7–15.4)
WBC: 10.4 10*3/uL (ref 3.4–10.8)

## 2020-10-19 LAB — GLUCOSE TOLERANCE, 2 HOURS W/ 1HR
Glucose, 1 hour: 162 mg/dL (ref 70–179)
Glucose, 2 hour: 129 mg/dL (ref 70–152)
Glucose, Fasting: 75 mg/dL (ref 70–91)

## 2020-10-19 LAB — HIV ANTIBODY (ROUTINE TESTING W REFLEX): HIV Screen 4th Generation wRfx: NONREACTIVE

## 2020-10-19 LAB — RPR: RPR Ser Ql: NONREACTIVE

## 2020-10-25 ENCOUNTER — Other Ambulatory Visit: Payer: Self-pay

## 2020-10-25 ENCOUNTER — Ambulatory Visit: Payer: Medicaid Other | Attending: Obstetrics

## 2020-10-25 ENCOUNTER — Encounter: Payer: Self-pay | Admitting: *Deleted

## 2020-10-25 ENCOUNTER — Ambulatory Visit: Payer: Medicaid Other | Admitting: *Deleted

## 2020-10-25 VITALS — BP 129/81 | HR 96

## 2020-10-25 DIAGNOSIS — Z141 Cystic fibrosis carrier: Secondary | ICD-10-CM | POA: Diagnosis present

## 2020-10-25 DIAGNOSIS — O0933 Supervision of pregnancy with insufficient antenatal care, third trimester: Secondary | ICD-10-CM | POA: Diagnosis not present

## 2020-10-25 DIAGNOSIS — O09892 Supervision of other high risk pregnancies, second trimester: Secondary | ICD-10-CM | POA: Insufficient documentation

## 2020-10-25 DIAGNOSIS — O0932 Supervision of pregnancy with insufficient antenatal care, second trimester: Secondary | ICD-10-CM | POA: Insufficient documentation

## 2020-10-25 DIAGNOSIS — Z3480 Encounter for supervision of other normal pregnancy, unspecified trimester: Secondary | ICD-10-CM

## 2020-10-25 DIAGNOSIS — O358XX0 Maternal care for other (suspected) fetal abnormality and damage, not applicable or unspecified: Secondary | ICD-10-CM

## 2020-10-25 DIAGNOSIS — D563 Thalassemia minor: Secondary | ICD-10-CM | POA: Diagnosis present

## 2020-10-25 DIAGNOSIS — O09893 Supervision of other high risk pregnancies, third trimester: Secondary | ICD-10-CM | POA: Diagnosis not present

## 2020-10-25 DIAGNOSIS — Z3A30 30 weeks gestation of pregnancy: Secondary | ICD-10-CM

## 2020-10-26 ENCOUNTER — Other Ambulatory Visit: Payer: Self-pay | Admitting: *Deleted

## 2020-10-26 DIAGNOSIS — O365990 Maternal care for other known or suspected poor fetal growth, unspecified trimester, not applicable or unspecified: Secondary | ICD-10-CM

## 2020-11-01 ENCOUNTER — Other Ambulatory Visit: Payer: Self-pay

## 2020-11-01 ENCOUNTER — Encounter: Payer: Self-pay | Admitting: Family Medicine

## 2020-11-01 ENCOUNTER — Ambulatory Visit (INDEPENDENT_AMBULATORY_CARE_PROVIDER_SITE_OTHER): Payer: Medicaid Other | Admitting: Family Medicine

## 2020-11-01 DIAGNOSIS — Z3480 Encounter for supervision of other normal pregnancy, unspecified trimester: Secondary | ICD-10-CM | POA: Diagnosis not present

## 2020-11-01 NOTE — Patient Instructions (Signed)

## 2020-11-01 NOTE — Progress Notes (Signed)
   Subjective:  Ronan Dion is a 20 y.o. G2P0010 at [redacted]w[redacted]d being seen today for ongoing prenatal care.  She is currently monitored for the following issues for this low-risk pregnancy and has Supervision of other normal pregnancy, antepartum and Prenatal care insufficient on their problem list.  Patient reports  possible leaking of fluid since yesterday .  Contractions: Not present. Vag. Bleeding: None.  Movement: Present. Denies leaking of fluid.   The following portions of the patient's history were reviewed and updated as appropriate: allergies, current medications, past family history, past medical history, past social history, past surgical history and problem list. Problem list updated.  Objective:   Vitals:   11/01/20 1519  BP: 137/89  Pulse: 89  Weight: 164 lb 3.2 oz (74.5 kg)    Fetal Status: Fetal Heart Rate (bpm): 147   Movement: Present     General:  Alert, oriented and cooperative. Patient is in no acute distress.  Skin: Skin is warm and dry. No rash noted.   Cardiovascular: Normal heart rate noted  Respiratory: Normal respiratory effort, no problems with respiration noted  Abdomen: Soft, gravid, appropriate for gestational age. Pain/Pressure: Absent     Pelvic: Vag. Bleeding: None     Cervical exam deferred        Extremities: Normal range of motion.  Edema: Mild pitting, slight indentation  Mental Status: Normal mood and affect. Normal behavior. Normal judgment and thought content.   Urinalysis:      Assessment and Plan:  Pregnancy: G2P0010 at [redacted]w[redacted]d  1. Supervision of other normal pregnancy, antepartum FHR and BP normal Reported leaking fluid on two occasions since yesterday Today on exam cervix visually closed, neg pool, neg fern  Preterm labor symptoms and general obstetric precautions including but not limited to vaginal bleeding, contractions, leaking of fluid and fetal movement were reviewed in detail with the patient. Please refer to After  Visit Summary for other counseling recommendations.  Return in 2 weeks (on 11/15/2020) for ob visit.   Venora Maples, MD

## 2020-11-02 ENCOUNTER — Encounter: Payer: Self-pay | Admitting: *Deleted

## 2020-11-03 ENCOUNTER — Telehealth: Payer: Self-pay | Admitting: Genetics

## 2020-11-03 NOTE — Telephone Encounter (Signed)
Called Carolyn Rivas to return her partner's carrier screening results. Her partner, Tera Helper, screened to NOT be a carrier for Cystic Fibrosis or Alpha Thalassemia. It is highly unlikely for the current pregnancy to be affected with either of those two recessive conditions. A negative result on carrier screening reduces the likelihood of being a carrier, however, does not entirely rule out the possibility.

## 2020-11-08 ENCOUNTER — Ambulatory Visit (INDEPENDENT_AMBULATORY_CARE_PROVIDER_SITE_OTHER): Payer: Medicaid Other

## 2020-11-08 ENCOUNTER — Other Ambulatory Visit: Payer: Self-pay

## 2020-11-08 ENCOUNTER — Inpatient Hospital Stay (HOSPITAL_COMMUNITY)
Admission: AD | Admit: 2020-11-08 | Discharge: 2020-11-08 | Disposition: A | Discharge status: Home / Self Care | Payer: Medicaid Other | Attending: Obstetrics and Gynecology | Admitting: Obstetrics and Gynecology

## 2020-11-08 ENCOUNTER — Encounter (HOSPITAL_COMMUNITY): Payer: Self-pay | Admitting: Obstetrics and Gynecology

## 2020-11-08 VITALS — BP 136/89 | HR 102 | Wt 164.6 lb

## 2020-11-08 DIAGNOSIS — Z3A32 32 weeks gestation of pregnancy: Secondary | ICD-10-CM | POA: Insufficient documentation

## 2020-11-08 DIAGNOSIS — O1413 Severe pre-eclampsia, third trimester: Secondary | ICD-10-CM

## 2020-11-08 DIAGNOSIS — Z3480 Encounter for supervision of other normal pregnancy, unspecified trimester: Secondary | ICD-10-CM

## 2020-11-08 DIAGNOSIS — O163 Unspecified maternal hypertension, third trimester: Secondary | ICD-10-CM

## 2020-11-08 DIAGNOSIS — O1493 Unspecified pre-eclampsia, third trimester: Secondary | ICD-10-CM | POA: Insufficient documentation

## 2020-11-08 DIAGNOSIS — R030 Elevated blood-pressure reading, without diagnosis of hypertension: Secondary | ICD-10-CM | POA: Diagnosis present

## 2020-11-08 LAB — URINALYSIS, ROUTINE W REFLEX MICROSCOPIC
Bilirubin Urine: NEGATIVE
Glucose, UA: NEGATIVE mg/dL
Hgb urine dipstick: NEGATIVE
Ketones, ur: NEGATIVE mg/dL
Leukocytes,Ua: NEGATIVE
Nitrite: NEGATIVE
Protein, ur: NEGATIVE mg/dL
Specific Gravity, Urine: 1.009 (ref 1.005–1.030)
pH: 7 (ref 5.0–8.0)

## 2020-11-08 LAB — COMPREHENSIVE METABOLIC PANEL
ALT: 10 U/L (ref 0–44)
AST: 18 U/L (ref 15–41)
Albumin: 2.8 g/dL — ABNORMAL LOW (ref 3.5–5.0)
Alkaline Phosphatase: 106 U/L (ref 38–126)
Anion gap: 10 (ref 5–15)
BUN: 5 mg/dL — ABNORMAL LOW (ref 6–20)
CO2: 20 mmol/L — ABNORMAL LOW (ref 22–32)
Calcium: 8.5 mg/dL — ABNORMAL LOW (ref 8.9–10.3)
Chloride: 105 mmol/L (ref 98–111)
Creatinine, Ser: 0.64 mg/dL (ref 0.44–1.00)
GFR, Estimated: >60 mL/min (ref >60)
Glucose, Bld: 83 mg/dL (ref 70–99)
Potassium: 3.3 mmol/L — ABNORMAL LOW (ref 3.5–5.1)
Sodium: 135 mmol/L (ref 135–145)
Total Bilirubin: 0.7 mg/dL (ref 0.3–1.2)
Total Protein: 5.9 g/dL — ABNORMAL LOW (ref 6.5–8.1)

## 2020-11-08 LAB — CBC
HCT: 31.4 % — ABNORMAL LOW (ref 36.0–46.0)
Hemoglobin: 9.9 g/dL — ABNORMAL LOW (ref 12.0–15.0)
MCH: 27.9 pg (ref 26.0–34.0)
MCHC: 31.5 g/dL (ref 30.0–36.0)
MCV: 88.5 fL (ref 80.0–100.0)
Platelets: 217 10*3/uL (ref 150–400)
RBC: 3.55 MIL/uL — ABNORMAL LOW (ref 3.87–5.11)
RDW: 14.3 % (ref 11.5–15.5)
WBC: 12.1 10*3/uL — ABNORMAL HIGH (ref 4.0–10.5)
nRBC: 0.2 % (ref 0.0–0.2)

## 2020-11-08 LAB — PROTEIN / CREATININE RATIO, URINE
Creatinine, Urine: 71.49 mg/dL
Protein Creatinine Ratio: 0.31 mg/mg{Cre} — ABNORMAL HIGH (ref 0.00–0.15)
Total Protein, Urine: 22 mg/dL

## 2020-11-08 MED ORDER — LABETALOL HCL 5 MG/ML IV SOLN: 40.0000 mg | INTRAVENOUS | Status: DC | PRN

## 2020-11-08 MED ORDER — HYDRALAZINE HCL 20 MG/ML IJ SOLN: 10.0000 mg | INTRAMUSCULAR | Status: DC | PRN

## 2020-11-08 MED ORDER — LABETALOL HCL 5 MG/ML IV SOLN
20.0000 mg | INTRAVENOUS | Status: DC | PRN
  Administered 2020-11-08: 20 mg via INTRAVENOUS
  Filled 2020-11-08: qty 4

## 2020-11-08 MED ORDER — LABETALOL HCL 5 MG/ML IV SOLN: 80.0000 mg | INTRAVENOUS | Status: DC | PRN

## 2020-11-08 NOTE — MAU Note (Signed)
Was at office today and b/p was elevated. Took it at home and b/p was 177/111. Denies visual changes, headache, RUQ pain. Occ pain in R lower abd but none now.

## 2020-11-08 NOTE — Discharge Instructions (Addendum)
Return to MAU for blood pressure 160 or higher (top number) and/or 110 or higher (bottom number), headaches not relieve with Tylenol and rest, right upper abdominal pain (sharp), decreased or no fetal movement, vaginal bleeding like a period, or strong contraction that seems like it won't go away.

## 2020-11-08 NOTE — Progress Notes (Signed)
Chart reviewed for nurse visit. Agree with plan of care.   Venora Maples, MD 11/08/20 2:00 PM

## 2020-11-08 NOTE — Progress Notes (Signed)
Pt showed up at front office with c/o possibly having pre-eclampsia. Pt states having intermittent headaches with swelling in both ankles and blurry vision at times. Pt has 1-2 + pitting edema in ankles/feet today. Pt states headache last night went away with food and drink, did not take Tylenol. Pt also states having blurry vision before pregnancy.   Pt does not know how to work BP cuff at home and doesn't know if works.  Pt given BP cuff from office today due to not having good transportation and not able to go to pharmacy.   BP today 155/88, take second time 136/89- Dr Crissie Reese aware.  FHT 155  Reviewed notes with Dr Crissie Reese. Pt advised to take daily BP and send into babyscripts app. Pt demonstrated how to do in app. Pt also advised to have labs today of CMP, CBC, Urine protein/CR ratio. Pt verbalized understanding and agreeable to plan of care.  Has next ROB on 11/15. Pt also advised to have another BP check this week in 2-3 days. Pt agreeable.  Nurse visit for BP check in 2-3 days. 11/10 at 830am.  Pt advised if has worsening symptoms and/or  BP over 160/110 to go to MAU. Pt verbalized understanding.   Judeth Cornfield, RN

## 2020-11-08 NOTE — MAU Provider Note (Signed)
History     CSN: [id number]  Arrival date and time: 11/08/20 2036   Event Date/Time   First Provider Initiated Contact with Patient 11/08/20 2151      Chief Complaint  Patient presents with   Hypertension   HPI [name] is a 20 y.o. year old G45P0010 female at [redacted]w[redacted]d weeks gestation who presents to MAU reporting elevated blood pressures at home and in the office today. She reports her BP was 177/111 at home this evening. She denies any visual changes, H/A, or RUQ pain. She reports occasional pain in RT lower abdomen earlier, but none now. She was called by Dr. Jolayne Panther to come to MAU for PEC evaluation. She reports that she has a strong paternal family history of HTN. She also reports being stressed and not being able to sleep enough lately, because her brother who has seizures lives with she and her mom. She states, "He refuses to take his medication and he has multiple seizures especially at night. I am the only one who catches the seizures, so I can hardly sleep in fear that he will have another seizure." She receives [facility] with [facility].  OB History     Gravida  2   Para      Term      Preterm      AB  1   Living         SAB      IAB  1   Ectopic      Multiple      Live Births              Past Medical History:  Diagnosis Date   Medical history non-contributory     Past Surgical History:  Procedure Laterality Date   NO PAST SURGERIES      History reviewed. No pertinent family history.  Social History   Tobacco Use   Smoking status: Never   Smokeless tobacco: Never  Vaping Use   Vaping Use: Never used  Substance Use Topics   Alcohol use: Never   Drug use: Never    Allergies: No Known Allergies  No medications prior to admission.    Review of Systems  Constitutional: Negative.   HENT: Negative.    Eyes: Negative.   Respiratory: Negative.    Cardiovascular: Negative.   Gastrointestinal: Negative.   Endocrine: Negative.    Genitourinary: Negative.  Negative for pelvic pain (had occ. pain in lower RT abdomen).  Musculoskeletal: Negative.   Skin: Negative.   Allergic/Immunologic: Negative.   Neurological: Negative.  Negative for headaches.  Hematological: Negative.   Psychiatric/Behavioral: Negative.    Physical Exam   Patient Vitals for the past 24 hrs:  BP Temp Pulse Resp SpO2 Height Weight  11/08/20 2315 (!) 160/97 -- 83 -- 100 % -- --  11/08/20 2310 -- -- -- -- 100 % -- --  11/08/20 2305 -- -- -- -- 99 % -- --  11/08/20 2300 (!) 151/86 -- 94 -- 98 % -- --  11/08/20 2234 130/88 -- 86 -- -- -- --  11/08/20 2231 -- -- -- -- 99 % -- --  11/08/20 2230 -- -- -- -- 99 % -- --  11/08/20 2225 -- -- -- -- 100 % -- --  11/08/20 2215 (!) 145/88 -- 71 -- 100 % -- --  11/08/20 2210 -- -- -- -- 99 % -- --  11/08/20 2205 -- -- -- -- 100 % -- --  11/08/20 2200 Marland Kitchen)  169/96 -- 72 -- 100 % -- --  11/08/20 2155 -- -- -- -- 100 % -- --  11/08/20 2145 (!) 161/102 -- 71 -- -- -- --  11/08/20 2144 -- -- -- -- 100 % -- --  11/08/20 2142 -- -- -- -- 100 % -- --  11/08/20 2135 -- -- -- -- 100 % -- --  11/08/20 2130 (!) 166/104 -- 81 -- 99 % -- --  11/08/20 2124 (!) 178/95 -- 69 -- -- -- --  11/08/20 2104 (!) 185/106 -- -- -- -- -- --  11/08/20 2102 -- 98.4 F (36.9 C) 72 17 100 % 5\' 1"  (1.549 m) 75.3 kg    Physical Exam Vitals and nursing note reviewed. Exam conducted with a chaperone present.  Constitutional:      Appearance: Normal appearance. She is normal weight.  Cardiovascular:     Rate and Rhythm: Normal rate.     Pulses: Normal pulses.  Pulmonary:     Effort: Pulmonary effort is normal.  Abdominal:     Palpations: Abdomen is soft.  Genitourinary:    Comments: Dilation: Closed Effacement (%): Thick Station:  (HIGH) Presentation: Vertex Exam by: , CNM  Neurological:     Mental Status: She is alert and oriented to person, place, and time.  Psychiatric:        Mood and Affect: Mood normal.         Behavior: Behavior normal.        Thought Content: Thought content normal.        Judgment: Judgment normal.   REACTIVE NST - FHR: 140 bpm / moderate variability / accels present / decels absent / TOCO: irregular UC's   MAU Course  Procedures  MDM CCUA Start IV CBC CMP P/C Ratio Serial BP's  Labetalol Protocol - mild improvement in BPs  *Consult with Dr. Carloyn Jaeger @ 2311 - notified of patient's complaints, assessments, lab & NST results, tx plan keep BP check on Thursday 11/10/20, give s/s of PEC and when to return to MAU - ok to d/c home, agrees with plan  Results for orders placed or performed during the hospital encounter of 11/08/20 (from the past 24 hour(s))  CBC     Status: Abnormal   Collection Time: 11/08/20  8:57 PM  Result Value Ref Range   WBC 12.1 (H) 4.0 - 10.5 K/uL   RBC 3.55 (L) 3.87 - 5.11 MIL/uL   Hemoglobin 9.9 (L) 12.0 - 15.0 g/dL   HCT 13/08/22 (L) 98.9 - 21.1 %   MCV 88.5 80.0 - 100.0 fL   MCH 27.9 26.0 - 34.0 pg   MCHC 31.5 30.0 - 36.0 g/dL   RDW 94.1 74.0 - 81.4 %   Platelets 217 150 - 400 K/uL   nRBC 0.2 0.0 - 0.2 %  Comprehensive metabolic panel     Status: Abnormal   Collection Time: 11/08/20  8:57 PM  Result Value Ref Range   Sodium 135 135 - 145 mmol/L   Potassium 3.3 (L) 3.5 - 5.1 mmol/L   Chloride 105 98 - 111 mmol/L   CO2 20 (L) 22 - 32 mmol/L   Glucose, Bld 83 70 - 99 mg/dL   BUN 5 (L) 6 - 20 mg/dL   Creatinine, Ser 13/08/22 0.44 - 1.00 mg/dL   Calcium 8.5 (L) 8.9 - 10.3 mg/dL   Total Protein 5.9 (L) 6.5 - 8.1 g/dL   Albumin 2.8 (L) 3.5 - 5.0 g/dL   AST 18 15 -  41 U/L   ALT 10 0 - 44 U/L   Alkaline Phosphatase 106 38 - 126 U/L   Total Bilirubin 0.7 0.3 - 1.2 mg/dL   GFR, Estimated >97 >35 mL/min   Anion gap 10 5 - 15  Protein / creatinine ratio, urine     Status: Abnormal   Collection Time: 11/08/20  8:57 PM  Result Value Ref Range   Creatinine, Urine 71.49 mg/dL   Total Protein, Urine 22 mg/dL   Protein Creatinine Ratio 0.31 (H)  0.00 - 0.15 mg/mg[Cre]  Urinalysis, Routine w reflex microscopic Urine, Clean Catch     Status: None   Collection Time: 11/08/20  9:05 PM  Result Value Ref Range   Color, Urine YELLOW YELLOW   APPearance CLEAR CLEAR   Specific Gravity, Urine 1.009 1.005 - 1.030   pH 7.0 5.0 - 8.0   Glucose, UA NEGATIVE NEGATIVE mg/dL   Hgb urine dipstick NEGATIVE NEGATIVE   Bilirubin Urine NEGATIVE NEGATIVE   Ketones, ur NEGATIVE NEGATIVE mg/dL   Protein, ur NEGATIVE NEGATIVE mg/dL   Nitrite NEGATIVE NEGATIVE   Leukocytes,Ua NEGATIVE NEGATIVE     Assessment and Plan  Pre-eclampsia in third trimester  - Advised to Return to MAU for blood pressure 160 or higher (top number) and/or 110 or higher (bottom number), headaches not relieve with Tylenol and rest, right upper abdominal pain (sharp), decreased or no fetal movement, vaginal bleeding like a period, or strong contraction that seems like it won't go away. - Discussed plan right now is for her to be induced at 37 weeks, unless her condition worsens - Explained that IOL will need to be scheduled through [facility] office due to timing of IOL  [redacted] weeks gestation of pregnancy   - Discharge patient - Keep scheduled appt for BP check on 11/10/2020 - Patient verbalized an understanding of the plan of care and agrees.    Raelyn Mora, CNM 11/08/2020, 9:51 PM

## 2020-11-09 ENCOUNTER — Telehealth: Payer: Self-pay | Admitting: *Deleted

## 2020-11-09 ENCOUNTER — Encounter (HOSPITAL_COMMUNITY): Payer: Self-pay | Admitting: Obstetrics and Gynecology

## 2020-11-09 ENCOUNTER — Other Ambulatory Visit: Payer: Self-pay

## 2020-11-09 ENCOUNTER — Inpatient Hospital Stay (HOSPITAL_COMMUNITY)
Admission: AD | Admit: 2020-11-09 | Discharge: 2020-11-22 | DRG: 807 | Disposition: A | Discharge status: Home / Self Care | Payer: Medicaid Other | Attending: Obstetrics & Gynecology | Admitting: Obstetrics & Gynecology

## 2020-11-09 DIAGNOSIS — Z141 Cystic fibrosis carrier: Secondary | ICD-10-CM

## 2020-11-09 DIAGNOSIS — O1002 Pre-existing essential hypertension complicating childbirth: Secondary | ICD-10-CM | POA: Diagnosis present

## 2020-11-09 DIAGNOSIS — Z20822 Contact with and (suspected) exposure to covid-19: Secondary | ICD-10-CM | POA: Diagnosis present

## 2020-11-09 DIAGNOSIS — O114 Pre-existing hypertension with pre-eclampsia, complicating childbirth: Principal | ICD-10-CM | POA: Diagnosis present

## 2020-11-09 DIAGNOSIS — Z3A32 32 weeks gestation of pregnancy: Secondary | ICD-10-CM

## 2020-11-09 DIAGNOSIS — D509 Iron deficiency anemia, unspecified: Secondary | ICD-10-CM | POA: Diagnosis present

## 2020-11-09 DIAGNOSIS — O9902 Anemia complicating childbirth: Secondary | ICD-10-CM | POA: Diagnosis present

## 2020-11-09 DIAGNOSIS — Z8759 Personal history of other complications of pregnancy, childbirth and the puerperium: Secondary | ICD-10-CM | POA: Diagnosis present

## 2020-11-09 DIAGNOSIS — O1410 Severe pre-eclampsia, unspecified trimester: Secondary | ICD-10-CM

## 2020-11-09 DIAGNOSIS — Z9289 Personal history of other medical treatment: Secondary | ICD-10-CM

## 2020-11-09 DIAGNOSIS — O9081 Anemia of the puerperium: Secondary | ICD-10-CM

## 2020-11-09 DIAGNOSIS — O10919 Unspecified pre-existing hypertension complicating pregnancy, unspecified trimester: Secondary | ICD-10-CM | POA: Diagnosis present

## 2020-11-09 DIAGNOSIS — Z3480 Encounter for supervision of other normal pregnancy, unspecified trimester: Secondary | ICD-10-CM

## 2020-11-09 DIAGNOSIS — O99019 Anemia complicating pregnancy, unspecified trimester: Secondary | ICD-10-CM | POA: Diagnosis present

## 2020-11-09 LAB — COMPREHENSIVE METABOLIC PANEL
ALT: 5 IU/L (ref 0–32)
ALT: 12 U/L (ref 0–44)
AST: 12 IU/L (ref 0–40)
AST: 18 U/L (ref 15–41)
Albumin/Globulin Ratio: 1.5 (ref 1.2–2.2)
Albumin: 3.3 g/dL — ABNORMAL LOW (ref 3.9–5.0)
Albumin: 3.1 g/dL — ABNORMAL LOW (ref 3.5–5.0)
Alkaline Phosphatase: 124 IU/L — ABNORMAL HIGH (ref 42–106)
Alkaline Phosphatase: 116 U/L (ref 38–126)
Anion gap: 9 (ref 5–15)
BUN/Creatinine Ratio: 7 — ABNORMAL LOW (ref 9–23)
BUN: 5 mg/dL — ABNORMAL LOW (ref 6–20)
BUN: <5 mg/dL — ABNORMAL LOW (ref 6–20)
Bilirubin Total: 0.3 mg/dL (ref 0.0–1.2)
CO2: 19 mmol/L — ABNORMAL LOW (ref 20–29)
CO2: 22 mmol/L (ref 22–32)
Calcium: 8.6 mg/dL — ABNORMAL LOW (ref 8.7–10.2)
Calcium: 8.7 mg/dL — ABNORMAL LOW (ref 8.9–10.3)
Chloride: 106 mmol/L (ref 96–106)
Chloride: 105 mmol/L (ref 98–111)
Creatinine, Ser: 0.73 mg/dL (ref 0.57–1.00)
Creatinine, Ser: 0.72 mg/dL (ref 0.44–1.00)
GFR, Estimated: >60 mL/min (ref >60)
Globulin, Total: 2.2 g/dL (ref 1.5–4.5)
Glucose, Bld: 65 mg/dL — ABNORMAL LOW (ref 70–99)
Glucose: 116 mg/dL — ABNORMAL HIGH (ref 70–99)
Potassium: 3.6 mmol/L (ref 3.5–5.2)
Potassium: 3.3 mmol/L — ABNORMAL LOW (ref 3.5–5.1)
Sodium: 138 mmol/L (ref 134–144)
Sodium: 136 mmol/L (ref 135–145)
Total Bilirubin: 0.8 mg/dL (ref 0.3–1.2)
Total Protein: 5.5 g/dL — ABNORMAL LOW (ref 6.0–8.5)
Total Protein: 6.4 g/dL — ABNORMAL LOW (ref 6.5–8.1)
eGFR: 121 mL/min/{1.73_m2} (ref >59)

## 2020-11-09 LAB — CBC
HCT: 31.4 % — ABNORMAL LOW (ref 36.0–46.0)
Hematocrit: 28.6 % — ABNORMAL LOW (ref 34.0–46.6)
Hemoglobin: 9.4 g/dL — ABNORMAL LOW (ref 11.1–15.9)
Hemoglobin: 9.9 g/dL — ABNORMAL LOW (ref 12.0–15.0)
MCH: 27.4 pg (ref 26.6–33.0)
MCH: 27.8 pg (ref 26.0–34.0)
MCHC: 32.9 g/dL (ref 31.5–35.7)
MCHC: 31.5 g/dL (ref 30.0–36.0)
MCV: 83 fL (ref 79–97)
MCV: 88.2 fL (ref 80.0–100.0)
Platelets: 232 10*3/uL (ref 150–450)
Platelets: 240 10*3/uL (ref 150–400)
RBC: 3.43 x10E6/uL — ABNORMAL LOW (ref 3.77–5.28)
RBC: 3.56 MIL/uL — ABNORMAL LOW (ref 3.87–5.11)
RDW: 13.2 % (ref 11.7–15.4)
RDW: 14.6 % (ref 11.5–15.5)
WBC: 10 10*3/uL (ref 3.4–10.8)
WBC: 11.2 10*3/uL — ABNORMAL HIGH (ref 4.0–10.5)
nRBC: 0.3 % — ABNORMAL HIGH (ref 0.0–0.2)

## 2020-11-09 LAB — PROTEIN / CREATININE RATIO, URINE
Creatinine, Urine: 225.8 mg/dL
Creatinine, Urine: 181.38 mg/dL
Protein Creatinine Ratio: 0.18 mg/mg{Cre} — ABNORMAL HIGH (ref 0.00–0.15)
Protein, Ur: 36.4 mg/dL
Protein/Creat Ratio: 161 mg/g creat (ref 0–200)
Total Protein, Urine: 33 mg/dL

## 2020-11-09 LAB — TYPE AND SCREEN
ABO/RH(D): O POS
Antibody Screen: NEGATIVE

## 2020-11-09 LAB — RESP PANEL BY RT-PCR (FLU A&B, COVID) ARPGX2
Influenza A by PCR: NEGATIVE
Influenza B by PCR: NEGATIVE
SARS Coronavirus 2 by RT PCR: NEGATIVE

## 2020-11-09 MED ORDER — ACETAMINOPHEN 325 MG PO TABS
650.0000 mg | ORAL_TABLET | ORAL | Status: DC | PRN
  Administered 2020-11-09 – 2020-11-16 (×2): 650 mg via ORAL
  Filled 2020-11-09 (×3): qty 2

## 2020-11-09 MED ORDER — LABETALOL HCL 5 MG/ML IV SOLN
40.0000 mg | INTRAVENOUS | Status: DC | PRN
  Administered 2020-11-09: 40 mg via INTRAVENOUS
  Filled 2020-11-09: qty 8

## 2020-11-09 MED ORDER — LABETALOL HCL 5 MG/ML IV SOLN
80.0000 mg | INTRAVENOUS | Status: DC | PRN
  Administered 2020-11-09: 80 mg via INTRAVENOUS
  Filled 2020-11-09: qty 16

## 2020-11-09 MED ORDER — DOCUSATE SODIUM 100 MG PO CAPS
100.0000 mg | ORAL_CAPSULE | Freq: Every day | ORAL | Status: DC
  Administered 2020-11-10: 100 mg via ORAL
  Filled 2020-11-09 (×2): qty 1

## 2020-11-09 MED ORDER — LABETALOL HCL 5 MG/ML IV SOLN: 80.0000 mg | INTRAVENOUS | Status: DC | PRN

## 2020-11-09 MED ORDER — LABETALOL HCL 5 MG/ML IV SOLN: 40.0000 mg | INTRAVENOUS | Status: DC | PRN

## 2020-11-09 MED ORDER — MAGNESIUM SULFATE 40 GM/1000ML IV SOLN
2.0000 g/h | INTRAVENOUS | Status: AC
  Administered 2020-11-09 – 2020-11-10 (×2): 2 g/h via INTRAVENOUS
  Filled 2020-11-09 (×3): qty 1000

## 2020-11-09 MED ORDER — LABETALOL HCL 5 MG/ML IV SOLN: 20.0000 mg | INTRAVENOUS | Status: DC | PRN

## 2020-11-09 MED ORDER — CALCIUM CARBONATE ANTACID 500 MG PO CHEW: 2.0000 | CHEWABLE_TABLET | ORAL | Status: DC | PRN

## 2020-11-09 MED ORDER — LACTATED RINGERS IV SOLN: INTRAVENOUS | Status: DC

## 2020-11-09 MED ORDER — PRENATAL MULTIVITAMIN CH
1.0000 | ORAL_TABLET | Freq: Every day | ORAL | Status: DC
  Administered 2020-11-11 – 2020-11-13 (×3): 1 via ORAL
  Filled 2020-11-09 (×4): qty 1

## 2020-11-09 MED ORDER — HYDRALAZINE HCL 20 MG/ML IJ SOLN: 10.0000 mg | INTRAMUSCULAR | Status: DC | PRN

## 2020-11-09 MED ORDER — ZOLPIDEM TARTRATE 5 MG PO TABS: 5.0000 mg | ORAL_TABLET | Freq: Every evening | ORAL | Status: DC | PRN

## 2020-11-09 MED ORDER — BETAMETHASONE SOD PHOS & ACET 6 (3-3) MG/ML IJ SUSP
12.0000 mg | Freq: Once | INTRAMUSCULAR | Status: AC
  Administered 2020-11-09: 12 mg via INTRAMUSCULAR
  Filled 2020-11-09: qty 5

## 2020-11-09 MED ORDER — MAGNESIUM SULFATE BOLUS VIA INFUSION
4.0000 g | Freq: Once | INTRAVENOUS | Status: AC
  Administered 2020-11-09: 4 g via INTRAVENOUS
  Filled 2020-11-09: qty 1000

## 2020-11-09 MED ORDER — BETAMETHASONE SOD PHOS & ACET 6 (3-3) MG/ML IJ SUSP
12.0000 mg | Freq: Once | INTRAMUSCULAR | Status: AC
  Administered 2020-11-10: 12 mg via INTRAMUSCULAR
  Filled 2020-11-09: qty 5

## 2020-11-09 MED ORDER — LABETALOL HCL 5 MG/ML IV SOLN
20.0000 mg | INTRAVENOUS | Status: DC | PRN
  Administered 2020-11-09: 20 mg via INTRAVENOUS
  Filled 2020-11-09 (×2): qty 4

## 2020-11-09 NOTE — MAU Provider Note (Signed)
History     CSN: [id number]  Arrival date and time: 11/09/20 1500   Event Date/Time   First Provider Initiated Contact with Patient 11/09/20 1555      Chief Complaint  Patient presents with   Hypertension   Carolyn Rivas is a 20 y.o. G2P0010 at 104w4d who receives care at [facility].  She presents today for Hypertension.  Patient came from home after office called regarding elevated bp via babyscripts.  Patient denies current HA, but reports she had one earlier that resolved with eating.  Patient denies SOB, RUQ pain, abdominal cramping/contractions, or visual disturbances. She endorses fetal movement and denies vaginal concerns.     OB History     Gravida  2   Para      Term      Preterm      AB  1   Living         SAB      IAB  1   Ectopic      Multiple      Live Births              Past Medical History:  Diagnosis Date   Medical history non-contributory     Past Surgical History:  Procedure Laterality Date   NO PAST SURGERIES      History reviewed. No pertinent family history.  Social History   Tobacco Use   Smoking status: Never   Smokeless tobacco: Never  Vaping Use   Vaping Use: Never used  Substance Use Topics   Alcohol use: Never   Drug use: Never    Allergies: No Known Allergies  No medications prior to admission.    Review of Systems  Eyes:  Negative for visual disturbance.  Gastrointestinal:  Negative for abdominal pain, nausea and vomiting.  Genitourinary:  Negative for vaginal bleeding and vaginal discharge.  Neurological:  Negative for dizziness and headaches.  Physical Exam   Blood pressure (!) 160/97, pulse 74, temperature 98.9 F (37.2 C), temperature source Oral, resp. rate 16, height 5\' 1"  (1.549 m), weight 75.1 kg, last menstrual period 03/26/2020, SpO2 100 %.  Physical Exam Vitals reviewed.  Constitutional:      General: She is not in acute distress.    Appearance: Normal appearance. She is not  toxic-appearing.  HENT:     Head: Normocephalic and atraumatic.  Eyes:     Conjunctiva/sclera: Conjunctivae normal.  Cardiovascular:     Rate and Rhythm: Normal rate and regular rhythm.     Heart sounds: Normal heart sounds.  Pulmonary:     Effort: Pulmonary effort is normal. No respiratory distress.     Breath sounds: Normal breath sounds.  Abdominal:     Palpations: Abdomen is soft.     Tenderness: There is no abdominal tenderness.  Musculoskeletal:        General: No tenderness.     Cervical back: Normal range of motion.     Right lower leg: 2+ Edema present.     Left lower leg: 2+ Edema present.     Right ankle: Swelling present.     Left ankle: Swelling present.     Right foot: Swelling present.     Left foot: Swelling present.  Skin:    General: Skin is warm and dry.  Neurological:     Mental Status: She is alert and oriented to person, place, and time.  Psychiatric:        Mood and Affect: Mood normal.  Thought Content: Thought content normal.    Fetal Assessment 145 bpm, Mod Var, -Decels, +Accels Toco: No ctx graphed  MAU Course   Results for orders placed or performed during the hospital encounter of 11/08/20 (from the past 24 hour(s))  CBC     Status: Abnormal   Collection Time: 11/08/20  8:57 PM  Result Value Ref Range   WBC 12.1 (H) 4.0 - 10.5 K/uL   RBC 3.55 (L) 3.87 - 5.11 MIL/uL   Hemoglobin 9.9 (L) 12.0 - 15.0 g/dL   HCT 66.4 (L) 40.3 - 47.4 %   MCV 88.5 80.0 - 100.0 fL   MCH 27.9 26.0 - 34.0 pg   MCHC 31.5 30.0 - 36.0 g/dL   RDW 25.9 56.3 - 87.5 %   Platelets 217 150 - 400 K/uL   nRBC 0.2 0.0 - 0.2 %  Comprehensive metabolic panel     Status: Abnormal   Collection Time: 11/08/20  8:57 PM  Result Value Ref Range   Sodium 135 135 - 145 mmol/L   Potassium 3.3 (L) 3.5 - 5.1 mmol/L   Chloride 105 98 - 111 mmol/L   CO2 20 (L) 22 - 32 mmol/L   Glucose, Bld 83 70 - 99 mg/dL   BUN 5 (L) 6 - 20 mg/dL   Creatinine, Ser 6.43 0.44 - 1.00 mg/dL    Calcium 8.5 (L) 8.9 - 10.3 mg/dL   Total Protein 5.9 (L) 6.5 - 8.1 g/dL   Albumin 2.8 (L) 3.5 - 5.0 g/dL   AST 18 15 - 41 U/L   ALT 10 0 - 44 U/L   Alkaline Phosphatase 106 38 - 126 U/L   Total Bilirubin 0.7 0.3 - 1.2 mg/dL   GFR, Estimated >32 >95 mL/min   Anion gap 10 5 - 15  Protein / creatinine ratio, urine     Status: Abnormal   Collection Time: 11/08/20  8:57 PM  Result Value Ref Range   Creatinine, Urine 71.49 mg/dL   Total Protein, Urine 22 mg/dL   Protein Creatinine Ratio 0.31 (H) 0.00 - 0.15 mg/mg[Cre]  Urinalysis, Routine w reflex microscopic Urine, Clean Catch     Status: None   Collection Time: 11/08/20  9:05 PM  Result Value Ref Range   Color, Urine YELLOW YELLOW   APPearance CLEAR CLEAR   Specific Gravity, Urine 1.009 1.005 - 1.030   pH 7.0 5.0 - 8.0   Glucose, UA NEGATIVE NEGATIVE mg/dL   Hgb urine dipstick NEGATIVE NEGATIVE   Bilirubin Urine NEGATIVE NEGATIVE   Ketones, ur NEGATIVE NEGATIVE mg/dL   Protein, ur NEGATIVE NEGATIVE mg/dL   Nitrite NEGATIVE NEGATIVE   Leukocytes,Ua NEGATIVE NEGATIVE   No results found.  MDM Physical Exam Labs: CBC, CMP, PC Ratio Measure BPQ15 min EFM BMZ MgSO4 Infusion Assessment and Plan  20 year old G2P0010  SIUP at 32.4 weeks Cat I FT PreEclampsia  -Labs ordered and collected. -Exam performed -Patient with initial pressure severe. -Will place PreE protocol with Labetalol dosing. -Will await results. -NST Reactive  Cherre Robins MSN, CNM 11/09/2020, 3:55 PM   Reassessment (4:41 PM)  -Patient with continued severe range bp. -Dr. Mitzi Hansen consulted and advised: *Continue to await labs as patient with PreE with SF and needs admission. *Will plan to admit to [facility] or L&D depending upon lab results. *Start MgSO4 infusion *Give BMZ. *Aware of NICU Status -Provider to bedside to discuss POC with patient. -Patient reports some anxiety regarding POC and questions if she can leave. -  Informed that she would have  to leave AMA and would need to sign papers stating she understands risks r/t leaving AMA.  -Patient states she will not leave, but wanted to go home to take care of some things. -Reiterated POC. -Will await results.   Reassessment (5:34 PM)  -Dr. Elonda Husky notified of lab results and no s/s of HELLP Syndrome. -Okay to admit to antenatal -Provider to place standard orders. -Patient updated on POC. -Questions if she will receive additional shots.  Reassured that she will not receive any tonight. -No other questions or concerns. -Nurse updated on POC.   Maryann Conners MSN, CNM Advanced Practice Provider, Center for [facility]

## 2020-11-09 NOTE — Telephone Encounter (Signed)
Reviewed patient MyChart message. Reviewed patient chart,, mau note from 11/08/20 and patient request for bp med with Dr. Crissie Reese. No new orders- follow instruction and plan from MAU visit. I called patient to discuss her MyChart  message. We discussed they did not prescribe bp medication because the treatment for severe preeclampsia is delivery and will not deliver preterm unless her preeclampsia worsens. I reviewed the paramaters MAU reviewed with last night to go back to hospital for bp readings 160 or higher systolic or 110 or higher diastolic, etc. She has nurse visit for bp check tomorrow. She reports bp earlier today 162/105 . I advised based on that she should go back to MAU for evaluation asap . I explained her plan of care will change as needed based on her assessment of bp and signs symptoms and may include delivery. We discussed this is life threatening for her and baby.  She confirms good fetal movement. She states had headache =5 earlier that resolved with eating. States her edema goes up and down same as last night. She states she is at work and gets off at 6 . I explained this can be life threatening for her and baby and I advice going to hospital now asap. She then states she doesn't have transportation- I advised her to call 911. She voices understanding. Alesana Magistro,RN

## 2020-11-09 NOTE — MAU Note (Signed)
Thought she had pre-eclampsia, and she found out last night she did. Was told if her BP was above a certain point to come back.  BP was up today, had a HA this morning (gone now) and her feet are more swollen. Denies visual changes or epigastric pain. No bleeding or leaking. Reports +FM

## 2020-11-10 ENCOUNTER — Ambulatory Visit: Payer: Medicaid Other

## 2020-11-10 ENCOUNTER — Inpatient Hospital Stay (HOSPITAL_BASED_OUTPATIENT_CLINIC_OR_DEPARTMENT_OTHER): Payer: Medicaid Other

## 2020-11-10 ENCOUNTER — Inpatient Hospital Stay (HOSPITAL_COMMUNITY): Payer: Medicaid Other

## 2020-11-10 DIAGNOSIS — Z3A32 32 weeks gestation of pregnancy: Secondary | ICD-10-CM

## 2020-11-10 DIAGNOSIS — O1413 Severe pre-eclampsia, third trimester: Secondary | ICD-10-CM

## 2020-11-10 DIAGNOSIS — O1410 Severe pre-eclampsia, unspecified trimester: Secondary | ICD-10-CM | POA: Diagnosis present

## 2020-11-10 DIAGNOSIS — Z8759 Personal history of other complications of pregnancy, childbirth and the puerperium: Secondary | ICD-10-CM | POA: Diagnosis present

## 2020-11-10 DIAGNOSIS — O358XX0 Maternal care for other (suspected) fetal abnormality and damage, not applicable or unspecified: Secondary | ICD-10-CM | POA: Diagnosis not present

## 2020-11-10 LAB — OB RESULTS CONSOLE GBS: GBS: NEGATIVE

## 2020-11-10 MED ORDER — POTASSIUM CHLORIDE CRYS ER 20 MEQ PO TBCR
20.0000 meq | EXTENDED_RELEASE_TABLET | Freq: Two times a day (BID) | ORAL | Status: AC
  Administered 2020-11-10 – 2020-11-12 (×5): 20 meq via ORAL
  Filled 2020-11-10 (×5): qty 1

## 2020-11-10 MED ORDER — LACTATED RINGERS IV SOLN: INTRAVENOUS | Status: AC

## 2020-11-10 MED ORDER — NIFEDIPINE ER OSMOTIC RELEASE 30 MG PO TB24
30.0000 mg | ORAL_TABLET | Freq: Every day | ORAL | Status: DC
  Administered 2020-11-10: 30 mg via ORAL
  Filled 2020-11-10: qty 1

## 2020-11-10 NOTE — H&P (Addendum)
History    CSN: [id number]   Arrival date and time: 11/09/20 1500    Event Date/Time   First Provider Initiated Contact with Patient 11/09/20 1555          Chief Complaint  Patient presents with   Hypertension    Carolyn Rivas is a 20 y.o. G2P0010 at [redacted]w[redacted]d who receives care at [facility].  She presents today for Hypertension.  Patient came from home after office called regarding elevated bp via babyscripts.  Patient denies current HA, but reports she had one earlier that resolved with eating.  Patient denies SOB, RUQ pain, abdominal cramping/contractions, or visual disturbances. She endorses fetal movement and denies vaginal concerns.       OB History       Gravida  2   Para      Term      Preterm      AB  1   Living           SAB      IAB  1   Ectopic      Multiple      Live Births                      Past Medical History:  Diagnosis Date   Medical history non-contributory             Past Surgical History:  Procedure Laterality Date   NO PAST SURGERIES          History reviewed. No pertinent family history.   Social History        Tobacco Use   Smoking status: Never   Smokeless tobacco: Never  Vaping Use   Vaping Use: Never used  Substance Use Topics   Alcohol use: Never   Drug use: Never      Allergies: No Known Allergies   No medications prior to admission.      Review of Systems  Eyes:  Negative for visual disturbance.  Gastrointestinal:  Negative for abdominal pain, nausea and vomiting.  Genitourinary:  Negative for vaginal bleeding and vaginal discharge.  Neurological:  Negative for dizziness and headaches.  Physical Exam    Blood pressure (!) 160/97, pulse 74, temperature 98.9 F (37.2 C), temperature source Oral, resp. rate 16, height 5\' 1"  (1.549 m), weight 75.1 kg, last menstrual period 03/26/2020, SpO2 100 %.   Physical Exam Vitals reviewed.  Constitutional:      General: She is not in acute distress.     Appearance: Normal appearance. She is not toxic-appearing.  HENT:     Head: Normocephalic and atraumatic.  Eyes:     Conjunctiva/sclera: Conjunctivae normal.  Cardiovascular:     Rate and Rhythm: Normal rate and regular rhythm.     Heart sounds: Normal heart sounds.  Pulmonary:     Effort: Pulmonary effort is normal. No respiratory distress.     Breath sounds: Normal breath sounds.  Abdominal:     Palpations: Abdomen is soft.     Tenderness: There is no abdominal tenderness.  Musculoskeletal:        General: No tenderness.     Cervical back: Normal range of motion.     Right lower leg: 2+ Edema present.     Left lower leg: 2+ Edema present.     Right ankle: Swelling present.     Left ankle: Swelling present.     Right foot: Swelling present.     Left foot: Swelling present.  Skin:    General: Skin is warm and dry.  Neurological:     Mental Status: She is alert and oriented to person, place, and time.  Psychiatric:        Mood and Affect: Mood normal.        Thought Content: Thought content normal.      Fetal Assessment 145 bpm, Mod Var, -Decels, +Accels Toco: No ctx graphed   MAU Course    Lab Results Last 24 Hours       Results for orders placed or performed during the hospital encounter of 11/08/20 (from the past 24 hour(s))  CBC     Status: Abnormal    Collection Time: 11/08/20  8:57 PM  Result Value Ref Range    WBC 12.1 (H) 4.0 - 10.5 K/uL    RBC 3.55 (L) 3.87 - 5.11 MIL/uL    Hemoglobin 9.9 (L) 12.0 - 15.0 g/dL    HCT 03.4 (L) 74.2 - 46.0 %    MCV 88.5 80.0 - 100.0 fL    MCH 27.9 26.0 - 34.0 pg    MCHC 31.5 30.0 - 36.0 g/dL    RDW 59.5 63.8 - 75.6 %    Platelets 217 150 - 400 K/uL    nRBC 0.2 0.0 - 0.2 %  Comprehensive metabolic panel     Status: Abnormal    Collection Time: 11/08/20  8:57 PM  Result Value Ref Range    Sodium 135 135 - 145 mmol/L    Potassium 3.3 (L) 3.5 - 5.1 mmol/L    Chloride 105 98 - 111 mmol/L    CO2 20 (L) 22 - 32 mmol/L     Glucose, Bld 83 70 - 99 mg/dL    BUN 5 (L) 6 - 20 mg/dL    Creatinine, Ser 4.33 0.44 - 1.00 mg/dL    Calcium 8.5 (L) 8.9 - 10.3 mg/dL    Total Protein 5.9 (L) 6.5 - 8.1 g/dL    Albumin 2.8 (L) 3.5 - 5.0 g/dL    AST 18 15 - 41 U/L    ALT 10 0 - 44 U/L    Alkaline Phosphatase 106 38 - 126 U/L    Total Bilirubin 0.7 0.3 - 1.2 mg/dL    GFR, Estimated >29 >51 mL/min    Anion gap 10 5 - 15  Protein / creatinine ratio, urine     Status: Abnormal    Collection Time: 11/08/20  8:57 PM  Result Value Ref Range    Creatinine, Urine 71.49 mg/dL    Total Protein, Urine 22 mg/dL    Protein Creatinine Ratio 0.31 (H) 0.00 - 0.15 mg/mg[Cre]  Urinalysis, Routine w reflex microscopic Urine, Clean Catch     Status: None    Collection Time: 11/08/20  9:05 PM  Result Value Ref Range    Color, Urine YELLOW YELLOW    APPearance CLEAR CLEAR    Specific Gravity, Urine 1.009 1.005 - 1.030    pH 7.0 5.0 - 8.0    Glucose, UA NEGATIVE NEGATIVE mg/dL    Hgb urine dipstick NEGATIVE NEGATIVE    Bilirubin Urine NEGATIVE NEGATIVE    Ketones, ur NEGATIVE NEGATIVE mg/dL    Protein, ur NEGATIVE NEGATIVE mg/dL    Nitrite NEGATIVE NEGATIVE    Leukocytes,Ua NEGATIVE NEGATIVE      No results found.   MDM Physical Exam Labs: CBC, CMP, PC Ratio Measure BPQ15 min EFM BMZ MgSO4 Infusion Assessment and Plan  20 year old G2P0010  SIUP  at 32.4 weeks Cat I FT PreEclampsia   -Labs ordered and collected. -Exam performed -Patient with initial pressure severe. -Will place PreE protocol with Labetalol dosing. -Will await results. -NST Reactive   Maryann Conners MSN, CNM 11/09/2020, 3:55 PM    Reassessment (4:41 PM)   -Patient with continued severe range bp. -Dr. Sheryn Bison consulted and advised: *Continue to await labs as patient with PreE with SF and needs admission. *Will plan to admit to [facility] or L&D depending upon lab results. *Start MgSO4 infusion *Give BMZ. *Aware of NICU Status -Provider to bedside  to discuss POC with patient. -Patient reports some anxiety regarding POC and questions if she can leave. -Informed that she would have to leave AMA and would need to sign papers stating she understands risks r/t leaving AMA.  -Patient states she will not leave, but wanted to go home to take care of some things. -Reiterated POC. -Will await results.    Reassessment (5:34 PM)   -Dr. Elonda Husky notified of lab results and no s/s of HELLP Syndrome. -Okay to admit to antenatal -Provider to place standard orders. -Patient updated on POC. -Questions if she will receive additional shots.  Reassured that she will not receive any tonight. -No other questions or concerns. -Nurse updated on POC.    Maryann Conners MSN, CNM Advanced Practice Provider, Center for [facility]          Electronically signed by Gavin Pound, CNM at 11/09/2020  6:04   Attestation of Attending Supervision of Advanced Practitioner (CNM/NP/PA): Evaluation and management procedures were performed by the Advanced Practitioner under my supervision and collaboration.  I have reviewed the Advanced Practitioner's note and chart, and I agree with the management and plan.  Jacelyn Grip MD Attending Physician for the Center for [facility]

## 2020-11-10 NOTE — Progress Notes (Signed)
Patient ID: Carolyn Rivas, female   DOB: 10/06/2000, 20 y.o.   MRN: [id number] FACULTY PRACTICE ANTEPARTUM(COMPREHENSIVE) NOTE  Carolyn Rivas is a 20 y.o. G2P0010 with Estimated Date of Delivery: 12/31/20   By  early ultrasound [redacted]w[redacted]d  who is admitted for pre eclampsia, severe features, BP.    Fetal presentation is unsure. Length of Stay:  0  Days  Date of admission:11/09/2020  Subjective: No complaints Patient reports the fetal movement as active. Patient reports uterine contraction  activity as none. Patient reports  vaginal bleeding as none. Patient describes fluid per vagina as None.  Vitals:  Blood pressure 135/62, pulse 92, temperature 98.5 F (36.9 C), temperature source Oral, resp. rate 18, height 5\' 1"  (1.549 m), weight 75.1 kg, last menstrual period 03/26/2020, SpO2 99 %. Vitals:   11/09/20 2225 11/09/20 2325 11/10/20 0030 11/10/20 0415  BP: (!) 141/87 132/60 132/78 135/62  Pulse: 98 98 92 92  Resp: 18 17 17 18   Temp:  98.6 F (37 C)  98.5 F (36.9 C)  TempSrc:  Oral  Oral  SpO2:  97%  99%  Weight:      Height:       Physical Examination:  General appearance - alert, well appearing, and in no distress Abdomen - soft, nontender, nondistended, no masses or organomegaly Fundal Height:  size equals dates Pelvic Exam:  normal external genitalia, vulva, vagina, cervix, uterus and adnexaExtremities: extremities normal, atraumatic, no cyanosis or edema with DTRs 2+ bilaterally Membranes:intact  Fetal Monitoring:  Baseline: 140s bpm, Variability: Fair (1-6 bpm), Accelerations: non reactive on magnesium, and Decelerations: Absent   non reactive  Labs:  Results for orders placed or performed during the hospital encounter of 11/09/20 (from the past 24 hour(s))  Comprehensive metabolic panel   Collection Time: 11/09/20  3:25 PM  Result Value Ref Range   Sodium 136 135 - 145 mmol/L   Potassium 3.3 (L) 3.5 - 5.1 mmol/L   Chloride 105 98 - 111 mmol/L   CO2 22  22 - 32 mmol/L   Glucose, Bld 65 (L) 70 - 99 mg/dL   BUN <5 (L) 6 - 20 mg/dL   Creatinine, Ser 13/09/22 0.44 - 1.00 mg/dL   Calcium 8.7 (L) 8.9 - 10.3 mg/dL   Total Protein 6.4 (L) 6.5 - 8.1 g/dL   Albumin 3.1 (L) 3.5 - 5.0 g/dL   AST 18 15 - 41 U/L   ALT 12 0 - 44 U/L   Alkaline Phosphatase 116 38 - 126 U/L   Total Bilirubin 0.8 0.3 - 1.2 mg/dL   GFR, Estimated 13/09/22 5.63 mL/min   Anion gap 9 5 - 15  CBC   Collection Time: 11/09/20  3:25 PM  Result Value Ref Range   WBC 11.2 (H) 4.0 - 10.5 K/uL   RBC 3.56 (L) 3.87 - 5.11 MIL/uL   Hemoglobin 9.9 (L) 12.0 - 15.0 g/dL   HCT >97 (L) 13/09/22 - 02.6 %   MCV 88.2 80.0 - 100.0 fL   MCH 27.8 26.0 - 34.0 pg   MCHC 31.5 30.0 - 36.0 g/dL   RDW 37.8 58.8 - 50.2 %   Platelets 240 150 - 400 K/uL   nRBC 0.3 (H) 0.0 - 0.2 %  Protein / creatinine ratio, urine   Collection Time: 11/09/20  3:50 PM  Result Value Ref Range   Creatinine, Urine 181.38 mg/dL   Total Protein, Urine 33 mg/dL   Protein Creatinine Ratio 0.18 (H) 0.00 - 0.15 mg/mg[Cre]  Resp Panel by RT-PCR (Flu A&B, Covid) Nasopharyngeal Swab   Collection Time: 11/09/20  5:32 PM   Specimen: Nasopharyngeal Swab; Nasopharyngeal(NP) swabs in vial transport medium  Result Value Ref Range   SARS Coronavirus 2 by RT PCR NEGATIVE NEGATIVE   Influenza A by PCR NEGATIVE NEGATIVE   Influenza B by PCR NEGATIVE NEGATIVE    Imaging Studies:    No results found.   Medications:  Scheduled  betamethasone acetate-betamethasone sodium phosphate  12 mg Intramuscular Once   docusate sodium  100 mg Oral Daily   NIFEdipine  30 mg Oral Daily   prenatal multivitamin  1 tablet Oral Q1200   I have reviewed the patient's current medications.  ASSESSMENT: G2P0010 [redacted]w[redacted]d Estimated Date of Delivery: 12/31/20  Pre eclampsia, severe features, BP  PLAN: >BMZ x 2 >Magnesium >procardia XL 30 >24 hour urine  Carolyn Rivas H Arna Luis 11/10/2020,7:37 AM

## 2020-11-11 DIAGNOSIS — O99019 Anemia complicating pregnancy, unspecified trimester: Secondary | ICD-10-CM

## 2020-11-11 DIAGNOSIS — Z3A33 33 weeks gestation of pregnancy: Secondary | ICD-10-CM

## 2020-11-11 DIAGNOSIS — D509 Iron deficiency anemia, unspecified: Secondary | ICD-10-CM

## 2020-11-11 DIAGNOSIS — O1413 Severe pre-eclampsia, third trimester: Secondary | ICD-10-CM | POA: Diagnosis not present

## 2020-11-11 LAB — PROTEIN, URINE, 24 HOUR
Collection Interval-UPROT: 24 hours
Protein, 24H Urine: 615 mg/d — ABNORMAL HIGH (ref 50–100)
Protein, Urine: 15 mg/dL
Urine Total Volume-UPROT: 4100 mL

## 2020-11-11 LAB — CREATININE, URINE, 24 HOUR
Collection Interval-UCRE24: 24 hours
Creatinine, 24H Ur: 2048 mg/d — ABNORMAL HIGH (ref 600–1800)
Creatinine, Urine: 49.94 mg/dL
Urine Total Volume-UCRE24: 4100 mL

## 2020-11-11 LAB — CERVICOVAGINAL ANCILLARY ONLY
Chlamydia: NEGATIVE
Comment: NEGATIVE
Comment: NORMAL
Neisseria Gonorrhea: NEGATIVE

## 2020-11-11 MED ORDER — LABETALOL HCL 5 MG/ML IV SOLN
20.0000 mg | INTRAVENOUS | Status: DC | PRN
  Administered 2020-11-11 – 2020-11-19 (×2): 20 mg via INTRAVENOUS
  Filled 2020-11-11 (×2): qty 4

## 2020-11-11 MED ORDER — LABETALOL HCL 5 MG/ML IV SOLN: 80.0000 mg | INTRAVENOUS | Status: DC | PRN

## 2020-11-11 MED ORDER — HYDRALAZINE HCL 20 MG/ML IJ SOLN: 10.0000 mg | INTRAMUSCULAR | Status: DC | PRN

## 2020-11-11 MED ORDER — NIFEDIPINE ER OSMOTIC RELEASE 30 MG PO TB24
30.0000 mg | ORAL_TABLET | Freq: Two times a day (BID) | ORAL | Status: DC
  Administered 2020-11-11 – 2020-11-22 (×22): 30 mg via ORAL
  Filled 2020-11-11 (×24): qty 1

## 2020-11-11 MED ORDER — LABETALOL HCL 5 MG/ML IV SOLN: 40.0000 mg | INTRAVENOUS | Status: DC | PRN

## 2020-11-11 NOTE — Plan of Care (Signed)
  Problem: Education: Goal: Knowledge of General Education information will improve Description: Including pain rating scale, medication(s)/side effects and non-pharmacologic comfort measures Outcome: Progressing   Problem: Health Behavior/Discharge Planning: Goal: Ability to manage health-related needs will improve Outcome: Progressing   Problem: Clinical Measurements: Goal: Ability to maintain clinical measurements within normal limits will improve Outcome: Progressing Goal: Will remain free from infection Outcome: Progressing Goal: Diagnostic test results will improve Outcome: Progressing Goal: Respiratory complications will improve Outcome: Progressing Goal: Cardiovascular complication will be avoided Outcome: Progressing   Problem: Activity: Goal: Risk for activity intolerance will decrease Outcome: Progressing   Problem: Nutrition: Goal: Adequate nutrition will be maintained Outcome: Progressing   Problem: Coping: Goal: Level of anxiety will decrease Outcome: Progressing   Problem: Elimination: Goal: Will not experience complications related to bowel motility Outcome: Progressing Goal: Will not experience complications related to urinary retention Outcome: Progressing   Problem: Pain Managment: Goal: General experience of comfort will improve Outcome: Progressing   Problem: Safety: Goal: Ability to remain free from injury will improve Outcome: Progressing   Problem: Skin Integrity: Goal: Risk for impaired skin integrity will decrease Outcome: Progressing   Problem: Education: Goal: Knowledge of disease or condition will improve Outcome: Progressing Goal: Knowledge of the prescribed therapeutic regimen will improve Outcome: Progressing Goal: Individualized Educational Video(s) Outcome: Progressing   Problem: Clinical Measurements: Goal: Complications related to the disease process, condition or treatment will be avoided or minimized Outcome:  Progressing   Problem: Education: Goal: Knowledge of disease or condition will improve Outcome: Progressing Goal: Knowledge of the prescribed therapeutic regimen will improve Outcome: Progressing   Problem: Fluid Volume: Goal: Peripheral tissue perfusion will improve Outcome: Progressing   Problem: Clinical Measurements: Goal: Complications related to disease process, condition or treatment will be avoided or minimized Outcome: Progressing

## 2020-11-11 NOTE — Progress Notes (Signed)
Daily Antepartum Note  Admission Date: 11/09/2020 Current Date: 11/11/2020 9:41 AM  Carolyn Rivas is a 20 y.o. G2P0010 @ [redacted]w[redacted]d, HD#3, admitted for severe pre-eclampsia (BPs).  Pregnancy complicated by: Patient Active Problem List   Diagnosis Date Noted   Severe pre-eclampsia 11/10/2020   Prenatal care insufficient 10/13/2020   Supervision of other normal pregnancy, antepartum 06/29/2020    Overnight/24hr events:  none  Subjective:  No s/s of severe pre-eclampsia, PTL or decreased FM  Objective:    Current Vital Signs 24h Vital Sign Ranges  T 98.2 F (36.8 C) Temp  Avg: 98.6 F (37 C)  Min: 98.2 F (36.8 C)  Max: 98.9 F (37.2 C)  BP (!) 152/103  BP  Min: 134/82  Max: 166/98  HR 87 Pulse  Avg: 96.6  Min: 78  Max: 113  RR 18 Resp  Avg: 18.3  Min: 18  Max: 20  SaO2 98 %  (Room Air) SpO2  Avg: 98.1 %  Min: 96 %  Max: 100 %       24 Hour I/O Current Shift I/O  Time Ins Outs 11/10 0701 - 11/11 0700 In: 1680 [P.O.:1680] Out: 6000 [Urine:6000] 11/11 0701 - 11/11 1900 In: -  Out: 400 [Urine:400]   Patient Vitals for the past 24 hrs:  BP Temp Temp src Pulse Resp SpO2  11/11/20 0823 (!) 152/103 -- -- 87 -- --  11/11/20 0809 (!) 166/98 98.2 F (36.8 C) Oral 78 18 98 %  11/11/20 0308 134/82 98.8 F (37.1 C) Oral 99 18 97 %  11/10/20 2305 (!) 142/91 98.9 F (37.2 C) Oral (!) 113 18 99 %  11/10/20 1935 (!) 152/98 98.7 F (37.1 C) Oral 94 20 100 %  11/10/20 1555 -- -- -- -- -- 96 %  11/10/20 1554 (!) 142/91 98.4 F (36.9 C) Oral (!) 101 18 97 %  11/10/20 1141 (!) 148/81 98.7 F (37.1 C) Oral (!) 104 18 100 %   Fetal Heart Tones: 135 baseline, +accels, no decels, mod variability Tocometry: quiet  Physical exam: General: Well nourished, well developed female in no acute distress. Abdomen: gravid nttp Cardiovascular: S1, S2 normal, no murmur, rub or gallop, regular rate and rhythm Respiratory: CTAB Extremities: no clubbing, cyanosis or edema Skin: Warm and  dry.   Medications: Current Facility-Administered Medications  Medication Dose Route Frequency Provider Last Rate Last Admin   acetaminophen (TYLENOL) tablet 650 mg  650 mg Oral Q4H PRN Gerrit Heck, CNM   650 mg at 11/11/20 0935   calcium carbonate (TUMS - dosed in mg elemental calcium) chewable tablet 400 mg of elemental calcium  2 tablet Oral Q4H PRN Gerrit Heck, CNM       docusate sodium (COLACE) capsule 100 mg  100 mg Oral Daily Gerrit Heck, CNM   100 mg at 11/10/20 9242   NIFEdipine (PROCARDIA-XL/NIFEDICAL-XL) 24 hr tablet 30 mg  30 mg Oral BID [place] Bing, MD   30 mg at 11/11/20 0930   potassium chloride SA (KLOR-CON) CR tablet 20 mEq  20 mEq Oral BID [place] Bing, MD   20 mEq at 11/11/20 0930   prenatal multivitamin tablet 1 tablet  1 tablet Oral Q1200 Gerrit Heck, CNM        Labs:   Pending: GC/CT, GBS  Recent Labs  Lab 11/08/20 1148 11/08/20 2057 11/09/20 1525  WBC 10.0 12.1* 11.2*  HGB 9.4* 9.9* 9.9*  HCT 28.6* 31.4* 31.4*  PLT 232 217 240    Recent Labs  Lab 11/08/20 1148 11/08/20 2057 11/09/20 1525  NA 138 135 136  K 3.6 3.3* 3.3*  CL 106 105 105  CO2 19* 20* 22  BUN 5* 5* <5*  CREATININE 0.73 0.64 0.72  CALCIUM 8.6* 8.5* 8.7*  PROT 5.5* 5.9* 6.4*  BILITOT 0.3 0.7 0.8  ALKPHOS 124* 106 116  ALT 5 10 12   AST 12 18 18   GLUCOSE 116* 83 65*    Radiology:  11/10: cephalic, bpp 8/8, afi 7.6 11/10: cephalic, bpp 6/10 (breathing and movements), afi 8.5 10/25: efw 15%, 1422g, ac 38%, afi 11  Assessment & Plan:  Pt stable *Pregnancy:bid NSTs, growth scan ordered for 11/15. F/u GBS, GC/CT *Severe pre-eclampsia: give AM dose early and increase from procardia xl 30 qday to bid. D/w her will try to get her to 34wks *Preterm: s/p bmz on 11/9-10. D/w NICU; formal consult pending *PPx: oob ad lib, scds *FEN/GI: regular diet *Dispo: see above. Inpatient until delivery.   12/15 MD Attending Center for [facility] (Faculty  Practice) GYN Consult Phone: [phone] (M-F, [phone]) & [phone]  (Off hours, weekends, holidays)

## 2020-11-12 DIAGNOSIS — O1413 Severe pre-eclampsia, third trimester: Secondary | ICD-10-CM | POA: Diagnosis not present

## 2020-11-12 DIAGNOSIS — D509 Iron deficiency anemia, unspecified: Secondary | ICD-10-CM | POA: Diagnosis not present

## 2020-11-12 DIAGNOSIS — O99019 Anemia complicating pregnancy, unspecified trimester: Secondary | ICD-10-CM | POA: Diagnosis not present

## 2020-11-12 DIAGNOSIS — Z3A33 33 weeks gestation of pregnancy: Secondary | ICD-10-CM | POA: Diagnosis not present

## 2020-11-12 LAB — CBC
HCT: 28.1 % — ABNORMAL LOW (ref 36.0–46.0)
Hemoglobin: 9 g/dL — ABNORMAL LOW (ref 12.0–15.0)
MCH: 28.3 pg (ref 26.0–34.0)
MCHC: 32 g/dL (ref 30.0–36.0)
MCV: 88.4 fL (ref 80.0–100.0)
Platelets: 223 10*3/uL (ref 150–400)
RBC: 3.18 MIL/uL — ABNORMAL LOW (ref 3.87–5.11)
RDW: 15.1 % (ref 11.5–15.5)
WBC: 13.6 10*3/uL — ABNORMAL HIGH (ref 4.0–10.5)
nRBC: 0.8 % — ABNORMAL HIGH (ref 0.0–0.2)

## 2020-11-12 LAB — COMPREHENSIVE METABOLIC PANEL
ALT: 11 U/L (ref 0–44)
AST: 18 U/L (ref 15–41)
Albumin: 2.4 g/dL — ABNORMAL LOW (ref 3.5–5.0)
Alkaline Phosphatase: 109 U/L (ref 38–126)
Anion gap: 6 (ref 5–15)
BUN: 7 mg/dL (ref 6–20)
CO2: 21 mmol/L — ABNORMAL LOW (ref 22–32)
Calcium: 8.2 mg/dL — ABNORMAL LOW (ref 8.9–10.3)
Chloride: 108 mmol/L (ref 98–111)
Creatinine, Ser: 0.66 mg/dL (ref 0.44–1.00)
GFR, Estimated: >60 mL/min (ref >60)
Glucose, Bld: 90 mg/dL (ref 70–99)
Potassium: 4.4 mmol/L (ref 3.5–5.1)
Sodium: 135 mmol/L (ref 135–145)
Total Bilirubin: 0.5 mg/dL (ref 0.3–1.2)
Total Protein: 5.6 g/dL — ABNORMAL LOW (ref 6.5–8.1)

## 2020-11-12 LAB — IRON AND TIBC
Iron: 205 ug/dL — ABNORMAL HIGH (ref 28–170)
Saturation Ratios: 36 % — ABNORMAL HIGH (ref 10.4–31.8)
TIBC: 571 ug/dL — ABNORMAL HIGH (ref 250–450)
UIBC: 366 ug/dL

## 2020-11-12 LAB — FERRITIN: Ferritin: 14 ng/mL (ref 11–307)

## 2020-11-12 LAB — TYPE AND SCREEN
ABO/RH(D): O POS
Antibody Screen: NEGATIVE

## 2020-11-12 LAB — RETICULOCYTES
Immature Retic Fract: 38.6 % — ABNORMAL HIGH (ref 2.3–15.9)
RBC.: 3.15 MIL/uL — ABNORMAL LOW (ref 3.87–5.11)
Retic Count, Absolute: 92 10*3/uL (ref 19.0–186.0)
Retic Ct Pct: 2.9 % (ref 0.4–3.1)

## 2020-11-12 LAB — CULTURE, BETA STREP (GROUP B ONLY)

## 2020-11-12 LAB — FOLATE: Folate: 16.9 ng/mL (ref >5.9)

## 2020-11-12 LAB — VITAMIN B12: Vitamin B-12: 200 pg/mL (ref 180–914)

## 2020-11-12 MED ORDER — DOCUSATE SODIUM 100 MG PO CAPS: 100.0000 mg | ORAL_CAPSULE | Freq: Two times a day (BID) | ORAL | Status: DC | PRN

## 2020-11-12 NOTE — Progress Notes (Signed)
Daily Antepartum Note  Admission Date: 11/09/2020 Current Date: 11/12/2020 9:05 AM  Carolyn Rivas is a 20 y.o. G2P0010 @ [redacted]w[redacted]d, HD#4, admitted for severe pre-eclampsia (BPs).  Pregnancy complicated by: Patient Active Problem List   Diagnosis Date Noted   Severe pre-eclampsia 11/10/2020   Prenatal care insufficient 10/13/2020   Supervision of other normal pregnancy, antepartum 06/29/2020    Overnight/24hr events:  none  Subjective:  No s/s of severe pre-eclampsia, PTL or decreased FM Patient states she feels better this morning.   Objective:    Current Vital Signs 24h Vital Sign Ranges  T 98.6 F (37 C) Temp  Avg: 98.4 F (36.9 C)  Min: 98 F (36.7 C)  Max: 98.9 F (37.2 C)  BP (!) 143/93  BP  Min: 143/93  Max: 167/99  HR 93 Pulse  Avg: 84.3  Min: 71  Max: 97  RR 16 Resp  Avg: 17.7  Min: 16  Max: 18  SaO2 99 %  (Room Air) SpO2  Avg: 98.7 %  Min: 97 %  Max: 100 %       24 Hour I/O Current Shift I/O  Time Ins Outs 11/11 0701 - 11/12 0700 In: 240 [P.O.:240] Out: 1800 [Urine:1800] No intake/output data recorded.   Patient Vitals for the past 24 hrs:  BP Temp Temp src Pulse Resp SpO2  11/12/20 0718 (!) 143/93 98.6 F (37 C) Oral 93 16 99 %  11/12/20 0404 (!) 144/91 98 F (36.7 C) Oral 93 18 98 %  11/12/20 0000 (!) 156/97 98.7 F (37.1 C) Oral 83 18 97 %  11/11/20 1921 (!) 154/94 98.9 F (37.2 C) Oral 71 18 100 %  11/11/20 1622 (!) 152/92 -- -- 97 -- --  11/11/20 1555 (!) 163/96 -- -- 86 -- --  11/11/20 1540 (!) 160/95 98.2 F (36.8 C) Oral 81 18 100 %  11/11/20 1223 (!) 159/98 -- -- 77 -- --  11/11/20 1208 (!) 167/99 98 F (36.7 C) Oral 78 18 98 %    Fetal Heart Tones: 145 baseline, +accels, no decels, mod variability Tocometry: quiet  Physical exam: General: Well nourished, well developed female in no acute distress. Abdomen: gravid nttp Cardiovascular: S1, S2 normal, no murmur, rub or gallop, regular rate and rhythm Respiratory:  CTAB Extremities: no clubbing, cyanosis or edema Skin: Warm and dry.   Medications: Current Facility-Administered Medications  Medication Dose Route Frequency Provider Last Rate Last Admin   acetaminophen (TYLENOL) tablet 650 mg  650 mg Oral Q4H PRN Gerrit Heck, CNM   650 mg at 11/09/20 1938   calcium carbonate (TUMS - dosed in mg elemental calcium) chewable tablet 400 mg of elemental calcium  2 tablet Oral Q4H PRN Gerrit Heck, CNM       docusate sodium (COLACE) capsule 100 mg  100 mg Oral BID PRN [place] Bing, MD       labetalol (NORMODYNE) injection 20 mg  20 mg Intravenous PRN [place] Bing, MD   20 mg at 11/11/20 1611   And   labetalol (NORMODYNE) injection 40 mg  40 mg Intravenous PRN [place] Bing, MD       And   labetalol (NORMODYNE) injection 80 mg  80 mg Intravenous PRN [place] Bing, MD       And   hydrALAZINE (APRESOLINE) injection 10 mg  10 mg Intravenous PRN [place] Bing, MD       NIFEdipine (PROCARDIA-XL/NIFEDICAL-XL) 24 hr tablet 30 mg  30 mg Oral BID [place] Bing, MD  30 mg at 11/11/20 2158   potassium chloride SA (KLOR-CON) CR tablet 20 mEq  20 mEq Oral BID [place] Bing, MD   20 mEq at 11/11/20 2158   prenatal multivitamin tablet 1 tablet  1 tablet Oral Q1200 Gerrit Heck, CNM   1 tablet at 11/11/20 1253    Labs:  Pending: GBS  24h urine: 615  Recent Labs  Lab 11/08/20 2057 11/09/20 1525 11/12/20 0712  WBC 12.1* 11.2* 13.6*  HGB 9.9* 9.9* 9.0*  HCT 31.4* 31.4* 28.1*  PLT 217 240 223    Recent Labs  Lab 11/08/20 2057 11/09/20 1525 11/12/20 0712  NA 135 136 135  K 3.3* 3.3* 4.4  CL 105 105 108  CO2 20* 22 21*  BUN 5* <5* 7  CREATININE 0.64 0.72 0.66  CALCIUM 8.5* 8.7* 8.2*  PROT 5.9* 6.4* 5.6*  BILITOT 0.7 0.8 0.5  ALKPHOS 106 116 109  ALT 10 12 11   AST 18 18 18   GLUCOSE 83 65* 90     Radiology:  11/10: cephalic, bpp 8/8, afi 7.6 10/25: efw 15%, 1422g, ac 38%, afi 11  Assessment & Plan:  Pt  stable *Pregnancy: reactive NST yesterday. bid NSTs, growth scan ordered for 11/15. F/u GBS *Severe pre-eclampsia: watch BPs. Plan for delivery at 34wks at the latest.  *Preterm: s/p bmz on 11/9-10. D/w NICU; formal consult pending *Anemia: f/u panel  *PPx: oob ad lib, scds *FEN/GI: regular diet *Dispo: see above. Inpatient until delivery.   12/15 MD Attending Center for [facility] (Faculty Practice) GYN Consult Phone: [phone] (M-F, 0800-1700) & [phone]  (Off hours, weekends, holidays)

## 2020-11-12 NOTE — Progress Notes (Signed)
CSW acknowledges, and attempt to complete consult for emotional, and financial resources. MOB refuse to speak with CSW, and decline resources.   Dolores Frame, MSW, LCSW-A Clinical Social Worker- Weekends [phone]

## 2020-11-12 NOTE — Consult Note (Signed)
Neonatology Consult to Antenatal Patient:  I was asked by Dr. Vergie Living to see this patient in order to provide antenatal counseling due to prematurity.  [name] was admitted 11/09/20 at 32w 4d for hypertension work up c/w severe pre-eclampsia. She is currently not having active labor. She is BMZ complete, has received IV magnesium course and is and receiving Labetalol/Procardia.  Reactive NST.  Intact membranes.  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  Expecting a boy.  I spoke with the patient. We discussed the worst case of delivery in the next 1-2 days vs expectant management until delivery, including usual DR management, possible respiratory complications and need for support, IV access, feedings (mother open to breast feeding and pumping, which was encouraged, and is agreeable to donor breast milk), LOS, Mortality and Morbidity, and long term outcomes. She did have usual questions at this time which were answered. We would be glad to come back if she has more questions later.  Thank you for asking me to see this patient.  Dineen Kid Leary Roca, MD Neonatologist  The total length of face-to-face or floor/unit time for this encounter was 25 minutes. Counseling and/or coordination of care was 40 minutes of the above.

## 2020-11-13 DIAGNOSIS — O1413 Severe pre-eclampsia, third trimester: Secondary | ICD-10-CM | POA: Diagnosis not present

## 2020-11-13 DIAGNOSIS — O99019 Anemia complicating pregnancy, unspecified trimester: Secondary | ICD-10-CM | POA: Diagnosis not present

## 2020-11-13 DIAGNOSIS — D509 Iron deficiency anemia, unspecified: Secondary | ICD-10-CM | POA: Diagnosis not present

## 2020-11-13 DIAGNOSIS — Z3A33 33 weeks gestation of pregnancy: Secondary | ICD-10-CM | POA: Diagnosis not present

## 2020-11-13 MED ORDER — FERROUS SULFATE 325 (65 FE) MG PO TABS
325.0000 mg | ORAL_TABLET | ORAL | Status: DC
  Administered 2020-11-13 – 2020-11-19 (×4): 325 mg via ORAL
  Filled 2020-11-13 (×4): qty 1

## 2020-11-13 MED ORDER — DOCUSATE SODIUM 100 MG PO CAPS
100.0000 mg | ORAL_CAPSULE | Freq: Every day | ORAL | Status: DC
  Administered 2020-11-13 – 2020-11-19 (×7): 100 mg via ORAL
  Filled 2020-11-13 (×7): qty 1

## 2020-11-13 NOTE — Progress Notes (Signed)
Daily Antepartum Note  Admission Date: 11/09/2020 Current Date: 11/13/2020 9:55 AM  Carolyn Rivas is a 20 y.o. G2P0010 @ [redacted]w[redacted]d, HD#5, admitted for severe pre-eclampsia (BPs).  Pregnancy complicated by: Patient Active Problem List   Diagnosis Date Noted   Severe pre-eclampsia 11/10/2020   Prenatal care insufficient 10/13/2020   Supervision of other normal pregnancy, antepartum 06/29/2020    Overnight/24hr events:  none  Subjective:  No s/s of severe pre-eclampsia, PTL or decreased FM  Objective:    Current Vital Signs 24h Vital Sign Ranges  T 98.8 F (37.1 C) Temp  Avg: 98.7 F (37.1 C)  Min: 98 F (36.7 C)  Max: 99.2 F (37.3 C)  BP 138/89  BP  Min: 138/89  Max: 150/91  HR 97 Pulse  Avg: 93.8  Min: 84  Max: 101  RR 15 Resp  Avg: 16.5  Min: 15  Max: 18  SaO2 100 %  (room air) SpO2  Avg: 82.8 %  Min: 0 %  Max: 100 %       24 Hour I/O Current Shift I/O  Time Ins Outs 11/12 0701 - 11/13 0700 In: 1080 [P.O.:1080] Out: 1800 [Urine:1800] 11/13 0701 - 11/13 1900 In: -  Out: 400 [Urine:400]   Fetal Heart Tones: 140 baseline, +accels, no decels, mod variability Tocometry: quiet  Physical exam: General: Well nourished, well developed female in no acute distress. Abdomen: gravid nttp Cardiovascular: S1, S2 normal, no murmur, rub or gallop, regular rate and rhythm Respiratory: CTAB Extremities: no clubbing, cyanosis or edema Skin: Warm and dry.   Medications: Current Facility-Administered Medications  Medication Dose Route Frequency Provider Last Rate Last Admin   acetaminophen (TYLENOL) tablet 650 mg  650 mg Oral Q4H PRN Gerrit Heck, CNM   650 mg at 11/09/20 1938   calcium carbonate (TUMS - dosed in mg elemental calcium) chewable tablet 400 mg of elemental calcium  2 tablet Oral Q4H PRN Gerrit Heck, CNM       docusate sodium (COLACE) capsule 100 mg  100 mg Oral BID PRN [place] Bing, MD       labetalol (NORMODYNE) injection 20 mg  20 mg Intravenous  PRN [place] Bing, MD   20 mg at 11/11/20 1611   And   labetalol (NORMODYNE) injection 40 mg  40 mg Intravenous PRN [place] Bing, MD       And   labetalol (NORMODYNE) injection 80 mg  80 mg Intravenous PRN [place] Bing, MD       And   hydrALAZINE (APRESOLINE) injection 10 mg  10 mg Intravenous PRN [place] Bing, MD       NIFEdipine (PROCARDIA-XL/NIFEDICAL-XL) 24 hr tablet 30 mg  30 mg Oral BID  Bing, MD   30 mg at 11/13/20 0951   prenatal multivitamin tablet 1 tablet  1 tablet Oral Q1200 Gerrit Heck, CNM   1 tablet at 11/12/20 0913    Labs:  GBS neg  Recent Labs  Lab 11/08/20 2057 11/09/20 1525 11/12/20 0712  WBC 12.1* 11.2* 13.6*  HGB 9.9* 9.9* 9.0*  HCT 31.4* 31.4* 28.1*  PLT 217 240 223    Recent Labs  Lab 11/08/20 2057 11/09/20 1525 11/12/20 0712  NA 135 136 135  K 3.3* 3.3* 4.4  CL 105 105 108  CO2 20* 22 21*  BUN 5* <5* 7  CREATININE 0.64 0.72 0.66  CALCIUM 8.5* 8.7* 8.2*  PROT 5.9* 6.4* 5.6*  BILITOT 0.7 0.8 0.5  ALKPHOS 106 116 109  ALT 10 12 11  AST 18 18 18   GLUCOSE 83 65* 90    Radiology:  11/10: cephalic, bpp 8/8, afi 7.6 10/25: efw 15%, 1422g, ac 38%, afi 11  Assessment & Plan:  Pt stable *Pregnancy: reactive NST yesterday. bid NSTs, growth scan ordered for 11/15. -GBS neg *Severe pre-eclampsia: watch BPs. Plan for delivery at 34wks at the latest.  *Preterm: s/p bmz on 11/9-10 and nicu consult *Anemia: d/w pt and decreased need for transfusion with delivery but pt decliens IV iron. She is amenable to PO *PPx: oob ad lib, scds *FEN/GI: regular diet *Dispo: see above. Inpatient until delivery.   02-22-1981 MD Attending Center for [facility] (Faculty Practice) GYN Consult Phone: [phone] (M-F, 0800-1700) & [phone]  (Off hours, weekends, holidays)

## 2020-11-14 DIAGNOSIS — O10913 Unspecified pre-existing hypertension complicating pregnancy, third trimester: Secondary | ICD-10-CM | POA: Diagnosis not present

## 2020-11-14 DIAGNOSIS — O1413 Severe pre-eclampsia, third trimester: Secondary | ICD-10-CM | POA: Diagnosis not present

## 2020-11-14 DIAGNOSIS — Z3A32 32 weeks gestation of pregnancy: Secondary | ICD-10-CM | POA: Diagnosis not present

## 2020-11-14 MED ORDER — COMPLETENATE 29-1 MG PO CHEW
1.0000 | CHEWABLE_TABLET | Freq: Every day | ORAL | Status: DC
  Administered 2020-11-14 – 2020-11-18 (×5): 1 via ORAL
  Filled 2020-11-14 (×7): qty 1

## 2020-11-14 NOTE — Progress Notes (Signed)
Patient ID: Carolyn Rivas, female   DOB: [birth year], 20 y.o.   MRN: [id number] FACULTY PRACTICE ANTEPARTUM(COMPREHENSIVE) NOTE  Carolyn Rivas is a 20 y.o. G2P0010 at [redacted]w[redacted]d by LMP who is admitted for preeclampsia with severe features.   Fetal presentation is cephalic. Length of Stay:  6 Days  Subjective: No complaint Patient reports the fetal movement as active. Patient reports uterine contraction  activity as none. Patient reports  vaginal bleeding as none. Patient describes fluid per vagina as None.  Vitals:  Blood pressure 130/79, pulse 94, temperature 98.6 F (37 C), temperature source Oral, resp. rate 16, height 5\' 1"  (1.549 m), weight 75.1 kg, last menstrual period 03/26/2020, SpO2 98 %. Physical Examination:  General appearance - alert, well appearing, and in no distress Heart - normal rate and regular rhythm Abdomen - soft, nontender, nondistended Fundal Height:  size equals dates Cervical Exam: Not evaluated.Extremities: extremities normal, atraumatic, no cyanosis or edema and Homans sign is negative, no sign of DVT  Membranes:intact  Fetal Monitoring:   Fetal Heart Rate A   Mode External filed at 11/13/2020 2237  Baseline Rate (A) 145 bpm filed at 11/13/2020 2237  Variability 6-25 BPM filed at 11/13/2020 2237  Accelerations 15 x 15 filed at 11/13/2020 2237  Decelerations None filed at 11/13/2020 2237    Labs:  No results found for this or any previous visit (from the past 24 hour(s)).   Medications:  Scheduled  docusate sodium  100 mg Oral Daily   ferrous sulfate  325 mg Oral QODAY   NIFEdipine  30 mg Oral BID   prenatal vitamin w/FE, FA  1 tablet Oral Q1200   I have reviewed the patient's current medications.  ASSESSMENT: Patient Active Problem List   Diagnosis Date Noted   Iron deficiency anemia during pregnancy, antepartum 11/13/2020   Severe pre-eclampsia 11/10/2020   Prenatal care insufficient 10/13/2020   Supervision of other normal  pregnancy, antepartum 06/29/2020    PLAN: Assessment & Plan:  Pt stable *Pregnancy: reactive NST yesterday. bid NSTs, growth scan ordered for 11/15. -GBS neg *Severe pre-eclampsia: watch BPs. Plan for delivery at 34wks at the latest.  *Preterm: s/p bmz on 11/9-10 and nicu consult *Anemia: d/w pt and decreased need for transfusion with delivery but pt decliens IV iron. She is amenable to PO *PPx: oob ad lib, scds *FEN/GI: regular diet *Dispo: see above. Inpatient until delivery.   02-22-1981 11/14/2020,10:47 AM

## 2020-11-15 ENCOUNTER — Encounter: Payer: Medicaid Other | Admitting: Family Medicine

## 2020-11-15 ENCOUNTER — Inpatient Hospital Stay (HOSPITAL_BASED_OUTPATIENT_CLINIC_OR_DEPARTMENT_OTHER): Payer: Medicaid Other

## 2020-11-15 ENCOUNTER — Ambulatory Visit: Payer: Medicaid Other

## 2020-11-15 DIAGNOSIS — O1002 Pre-existing essential hypertension complicating childbirth: Secondary | ICD-10-CM | POA: Diagnosis present

## 2020-11-15 DIAGNOSIS — O358XX0 Maternal care for other (suspected) fetal abnormality and damage, not applicable or unspecified: Secondary | ICD-10-CM

## 2020-11-15 DIAGNOSIS — O365930 Maternal care for other known or suspected poor fetal growth, third trimester, not applicable or unspecified: Secondary | ICD-10-CM | POA: Diagnosis not present

## 2020-11-15 DIAGNOSIS — O1413 Severe pre-eclampsia, third trimester: Secondary | ICD-10-CM

## 2020-11-15 DIAGNOSIS — Z20822 Contact with and (suspected) exposure to covid-19: Secondary | ICD-10-CM | POA: Diagnosis present

## 2020-11-15 DIAGNOSIS — Z141 Cystic fibrosis carrier: Secondary | ICD-10-CM | POA: Diagnosis not present

## 2020-11-15 DIAGNOSIS — O0933 Supervision of pregnancy with insufficient antenatal care, third trimester: Secondary | ICD-10-CM | POA: Diagnosis not present

## 2020-11-15 DIAGNOSIS — Z3A33 33 weeks gestation of pregnancy: Secondary | ICD-10-CM | POA: Diagnosis not present

## 2020-11-15 DIAGNOSIS — O1414 Severe pre-eclampsia complicating childbirth: Secondary | ICD-10-CM | POA: Diagnosis not present

## 2020-11-15 DIAGNOSIS — O114 Pre-existing hypertension with pre-eclampsia, complicating childbirth: Secondary | ICD-10-CM | POA: Diagnosis present

## 2020-11-15 DIAGNOSIS — Z3A32 32 weeks gestation of pregnancy: Secondary | ICD-10-CM | POA: Diagnosis not present

## 2020-11-15 DIAGNOSIS — R030 Elevated blood-pressure reading, without diagnosis of hypertension: Secondary | ICD-10-CM | POA: Diagnosis present

## 2020-11-15 DIAGNOSIS — O9902 Anemia complicating childbirth: Secondary | ICD-10-CM | POA: Diagnosis present

## 2020-11-15 DIAGNOSIS — D509 Iron deficiency anemia, unspecified: Secondary | ICD-10-CM | POA: Diagnosis present

## 2020-11-15 DIAGNOSIS — Z3A34 34 weeks gestation of pregnancy: Secondary | ICD-10-CM | POA: Diagnosis not present

## 2020-11-15 DIAGNOSIS — O10913 Unspecified pre-existing hypertension complicating pregnancy, third trimester: Secondary | ICD-10-CM | POA: Diagnosis not present

## 2020-11-15 LAB — CBC
HCT: 31.3 % — ABNORMAL LOW (ref 36.0–46.0)
Hemoglobin: 9.9 g/dL — ABNORMAL LOW (ref 12.0–15.0)
MCH: 27.7 pg (ref 26.0–34.0)
MCHC: 31.6 g/dL (ref 30.0–36.0)
MCV: 87.4 fL (ref 80.0–100.0)
Platelets: 218 10*3/uL (ref 150–400)
RBC: 3.58 MIL/uL — ABNORMAL LOW (ref 3.87–5.11)
RDW: 14.8 % (ref 11.5–15.5)
WBC: 10 10*3/uL (ref 4.0–10.5)
nRBC: 0.3 % — ABNORMAL HIGH (ref 0.0–0.2)

## 2020-11-15 LAB — COMPREHENSIVE METABOLIC PANEL
ALT: 10 U/L (ref 0–44)
AST: 15 U/L (ref 15–41)
Albumin: 2.5 g/dL — ABNORMAL LOW (ref 3.5–5.0)
Alkaline Phosphatase: 117 U/L (ref 38–126)
Anion gap: 6 (ref 5–15)
BUN: 5 mg/dL — ABNORMAL LOW (ref 6–20)
CO2: 22 mmol/L (ref 22–32)
Calcium: 8.3 mg/dL — ABNORMAL LOW (ref 8.9–10.3)
Chloride: 106 mmol/L (ref 98–111)
Creatinine, Ser: 0.62 mg/dL (ref 0.44–1.00)
GFR, Estimated: >60 mL/min (ref >60)
Glucose, Bld: 72 mg/dL (ref 70–99)
Potassium: 4 mmol/L (ref 3.5–5.1)
Sodium: 134 mmol/L — ABNORMAL LOW (ref 135–145)
Total Bilirubin: 0.4 mg/dL (ref 0.3–1.2)
Total Protein: 5.4 g/dL — ABNORMAL LOW (ref 6.5–8.1)

## 2020-11-15 LAB — TYPE AND SCREEN
ABO/RH(D): O POS
Antibody Screen: NEGATIVE

## 2020-11-15 NOTE — Progress Notes (Signed)
Patient ID: Carolyn Rivas, female   DOB: Nov 10, 2000, 20 y.o.   MRN: [id number] FACULTY PRACTICE ANTEPARTUM(COMPREHENSIVE) NOTE  Carolyn Rivas is a 20 y.o. G2P0010 at [redacted]w[redacted]d by LMP who is admitted for preeclampsia with severe features Fetal presentation is cephalic. Length of Stay:  0  Days  Subjective: No complaint Patient reports the fetal movement as active. Patient reports uterine contraction  activity as none. Patient reports  vaginal bleeding as none. Patient describes fluid per vagina as None.  Vitals:  Blood pressure 129/76, pulse 92, temperature 98 F (36.7 C), temperature source Oral, resp. rate 18, height 5\' 1"  (1.549 m), weight 75.1 kg, last menstrual period 03/26/2020, SpO2 100 %. Physical Examination:  General appearance - alert, well appearing, and in no distress Heart - normal rate and regular rhythm Abdomen - soft, nontender, nondistended Fundal Height:  size equals dates Cervical Exam: Not evaluated.. Extremities: extremities normal, atraumatic, no cyanosis or edema and Homans sign is negative, no sign of DVT Membranes:intact  Fetal Monitoring:   Fetal Heart Rate A   Mode External filed at 11/14/2020 2204  Baseline Rate (A) 150 bpm  [wandering baseline between  145 and 150] filed at 11/14/2020 2204  Variability 6-25 BPM filed at 11/14/2020 2204  Accelerations 15 x 15 filed at 11/14/2020 2204  Decelerations None filed at 11/14/2020 2204    Labs:  No results found for this or any previous visit (from the past 24 hour(s)).   Medications:  Scheduled  docusate sodium  100 mg Oral Daily   ferrous sulfate  325 mg Oral QODAY   NIFEdipine  30 mg Oral BID   prenatal vitamin w/FE, FA  1 tablet Oral Q1200   I have reviewed the patient's current medications.  ASSESSMENT: Patient Active Problem List   Diagnosis Date Noted   Iron deficiency anemia during pregnancy, antepartum 11/13/2020   Severe pre-eclampsia 11/10/2020   Prenatal care  insufficient 10/13/2020   Supervision of other normal pregnancy, antepartum 06/29/2020    PLAN: Pt stable *Pregnancy: reactive NST yesterday. bid NSTs, growth scan ordered for 11/15. -GBS neg *Severe pre-eclampsia: watch BPs. Plan for delivery at 34wks at the latest.  *Preterm: s/p bmz on 11/9-10 and nicu consult *Anemia: d/w pt and decreased need for transfusion with delivery but pt decliens IV iron. She is amenable to PO *PPx: oob ad lib, scds *FEN/GI: regular diet *Dispo: see above. Inpatient until delivery.   02-22-1981 11/15/2020,9:39 AM

## 2020-11-16 DIAGNOSIS — O10919 Unspecified pre-existing hypertension complicating pregnancy, unspecified trimester: Secondary | ICD-10-CM | POA: Diagnosis present

## 2020-11-16 NOTE — Progress Notes (Signed)
Patient ID: Carolyn Rivas, female   DOB: 2000-02-08, 20 y.o.   MRN: [id number] FACULTY PRACTICE ANTEPARTUM(COMPREHENSIVE) NOTE  Carolyn Rivas is a 20 y.o. G2P0010 at [redacted]w[redacted]d by LMP who is admitted for .preeclampsia with severe features   Fetal presentation is cephalic. Length of Stay:  1  Days  Subjective: No complaint Patient reports the fetal movement as active. Patient reports uterine contraction  activity as none. Patient reports  vaginal bleeding as none. Patient describes fluid per vagina as None.  Vitals:  Blood pressure 135/85, pulse 98, temperature 98.7 F (37.1 C), temperature source Oral, resp. rate 16, height 5\' 1"  (1.549 m), weight 75.1 kg, last menstrual period 03/26/2020, SpO2 100 %. Physical Examination:  General appearance - alert, well appearing, and in no distress Heart - normal rate and regular rhythm Abdomen - soft, nontender, nondistended Fundal Height:  size equals dates Cervical Exam: Not evaluated.. Extremities: extremities normal, atraumatic, no cyanosis or edema and Homans sign is negative, no sign of DVT Membranes:intact  Fetal Monitoring:   Fetal Heart Rate A   Mode External filed at 11/15/2020 2236  Baseline Rate (A) 145 bpm filed at 11/15/2020 2236  Variability 6-25 BPM filed at 11/15/2020 2236  Accelerations 10 x 10, 15 x 15 filed at 11/15/2020 2236  Decelerations None filed at 11/15/2020     Labs:  Results for orders placed or performed during the hospital encounter of 11/09/20 (from the past 24 hour(s))  Comprehensive metabolic panel   Collection Time: 11/15/20 10:51 AM  Result Value Ref Range   Sodium 134 (L) 135 - 145 mmol/L   Potassium 4.0 3.5 - 5.1 mmol/L   Chloride 106 98 - 111 mmol/L   CO2 22 22 - 32 mmol/L   Glucose, Bld 72 70 - 99 mg/dL   BUN 5 (L) 6 - 20 mg/dL   Creatinine, Ser 11/17/20 0.44 - 1.00 mg/dL   Calcium 8.3 (L) 8.9 - 10.3 mg/dL   Total Protein 5.4 (L) 6.5 - 8.1 g/dL   Albumin 2.5 (L) 3.5 - 5.0 g/dL    AST 15 15 - 41 U/L   ALT 10 0 - 44 U/L   Alkaline Phosphatase 117 38 - 126 U/L   Total Bilirubin 0.4 0.3 - 1.2 mg/dL   GFR, Estimated 2.70 >62 mL/min   Anion gap 6 5 - 15  CBC   Collection Time: 11/15/20 10:51 AM  Result Value Ref Range   WBC 10.0 4.0 - 10.5 K/uL   RBC 3.58 (L) 3.87 - 5.11 MIL/uL   Hemoglobin 9.9 (L) 12.0 - 15.0 g/dL   HCT 11/17/20 (L) 62.8 - 31.5 %   MCV 87.4 80.0 - 100.0 fL   MCH 27.7 26.0 - 34.0 pg   MCHC 31.6 30.0 - 36.0 g/dL   RDW 17.6 16.0 - 73.7 %   Platelets 218 150 - 400 K/uL   nRBC 0.3 (H) 0.0 - 0.2 %  Type and screen [facility]   Collection Time: 11/15/20 10:51 AM  Result Value Ref Range   ABO/RH(D) O POS    Antibody Screen NEG    Sample Expiration      11/18/2020,2359 Performed at [facility], [address] [place], [place]      Medications:  Scheduled  docusate sodium  100 mg Oral Daily   ferrous sulfate  325 mg Oral QODAY   NIFEdipine  30 mg Oral BID   prenatal vitamin w/FE, FA  1  tablet Oral Q1200   I have reviewed the patient's current medications.  ASSESSMENT: Patient Active Problem List   Diagnosis Date Noted   Chronic hypertension during pregnancy, antepartum 11/16/2020   Iron deficiency anemia during pregnancy, antepartum 11/13/2020   Severe pre-eclampsia 11/10/2020   Prenatal care insufficient 10/13/2020   Supervision of other normal pregnancy, antepartum 06/29/2020    PLAN: Severe pre-eclampsia: watch BPs. Plan for delivery at 34wks at the latest.   Scheryl Darter 11/16/2020,10:29 AM

## 2020-11-16 NOTE — Plan of Care (Signed)
  Problem: Health Behavior/Discharge Planning: Goal: Ability to manage health-related needs will improve Outcome: Progressing   Problem: Clinical Measurements: Goal: Ability to maintain clinical measurements within normal limits will improve Outcome: Progressing Goal: Will remain free from infection Outcome: Progressing Goal: Diagnostic test results will improve Outcome: Progressing Goal: Cardiovascular complication will be avoided Outcome: Progressing   Problem: Activity: Goal: Risk for activity intolerance will decrease Outcome: Progressing   Problem: Nutrition: Goal: Adequate nutrition will be maintained Outcome: Progressing   Problem: Coping: Goal: Level of anxiety will decrease Outcome: Progressing   Problem: Elimination: Goal: Will not experience complications related to bowel motility Outcome: Progressing Goal: Will not experience complications related to urinary retention Outcome: Progressing   Problem: Pain Managment: Goal: General experience of comfort will improve Outcome: Progressing   Problem: Safety: Goal: Ability to remain free from injury will improve Outcome: Progressing   Problem: Skin Integrity: Goal: Risk for impaired skin integrity will decrease Outcome: Progressing   Problem: Education: Goal: Knowledge of disease or condition will improve Outcome: Progressing Goal: Knowledge of the prescribed therapeutic regimen will improve Outcome: Progressing Goal: Individualized Educational Video(s) Outcome: Progressing   Problem: Clinical Measurements: Goal: Complications related to the disease process, condition or treatment will be avoided or minimized Outcome: Progressing   Problem: Education: Goal: Knowledge of disease or condition will improve Outcome: Progressing Goal: Knowledge of the prescribed therapeutic regimen will improve Outcome: Progressing   Problem: Fluid Volume: Goal: Peripheral tissue perfusion will improve Outcome:  Progressing   Problem: Clinical Measurements: Goal: Complications related to disease process, condition or treatment will be avoided or minimized Outcome: Progressing

## 2020-11-16 NOTE — Progress Notes (Signed)
Patient has requested to not be disturbed during the night. She has asked that hourly rounding be discontinued.   Donnie Aho RN

## 2020-11-17 NOTE — Plan of Care (Signed)
  Problem: Health Behavior/Discharge Planning: Goal: Ability to manage health-related needs will improve Outcome: Progressing   Problem: Clinical Measurements: Goal: Ability to maintain clinical measurements within normal limits will improve Outcome: Progressing Goal: Will remain free from infection Outcome: Progressing Goal: Diagnostic test results will improve Outcome: Progressing Goal: Cardiovascular complication will be avoided Outcome: Progressing   Problem: Activity: Goal: Risk for activity intolerance will decrease Outcome: Progressing   Problem: Nutrition: Goal: Adequate nutrition will be maintained Outcome: Progressing   Problem: Coping: Goal: Level of anxiety will decrease Outcome: Progressing   Problem: Elimination: Goal: Will not experience complications related to bowel motility Outcome: Progressing Goal: Will not experience complications related to urinary retention Outcome: Progressing   Problem: Pain Managment: Goal: General experience of comfort will improve Outcome: Progressing   Problem: Safety: Goal: Ability to remain free from injury will improve Outcome: Progressing   Problem: Skin Integrity: Goal: Risk for impaired skin integrity will decrease Outcome: Progressing   Problem: Education: Goal: Knowledge of disease or condition will improve Outcome: Progressing Goal: Knowledge of the prescribed therapeutic regimen will improve Outcome: Progressing Goal: Individualized Educational Video(s) Outcome: Progressing   Problem: Clinical Measurements: Goal: Complications related to the disease process, condition or treatment will be avoided or minimized Outcome: Progressing   Problem: Education: Goal: Knowledge of disease or condition will improve Outcome: Progressing Goal: Knowledge of the prescribed therapeutic regimen will improve Outcome: Progressing   Problem: Fluid Volume: Goal: Peripheral tissue perfusion will improve Outcome:  Progressing   Problem: Clinical Measurements: Goal: Complications related to disease process, condition or treatment will be avoided or minimized Outcome: Progressing

## 2020-11-17 NOTE — Progress Notes (Signed)
Patient ID: Carolyn Rivas, female   DOB: 2000-12-30, 20 y.o.   MRN: [id number] FACULTY PRACTICE ANTEPARTUM(COMPREHENSIVE) NOTE  Carolyn Rivas is a 20 y.o. G2P0010 at [redacted]w[redacted]d by LMP who is admitted for .preeclampsia with severe features  .   Fetal presentation is cephalic. Length of Stay:  2  Days  Subjective: No complaint Patient reports the fetal movement as active. Patient reports uterine contraction  activity as occasional . Patient reports  vaginal bleeding as none. Patient describes fluid per vagina as None.  Vitals:  Blood pressure (!) 139/99, pulse (!) 104, temperature 98.5 F (36.9 C), temperature source Axillary, resp. rate 16, height 5\' 1"  (1.549 m), weight 75.1 kg, last menstrual period 03/26/2020, SpO2 100 %. Physical Examination:  General appearance - alert, well appearing, and in no distress Heart - normal rate and regular rhythm Abdomen - soft, nontender, nondistended Fundal Height:  size equals dates Cervical Exam: Not evaluated. . Extremities: extremities normal, atraumatic, no cyanosis or edema and Homans sign is negative, no sign of DVT Membranes:intact  Fetal Monitoring:   Fetal Heart Rate A   Mode External  [removed] filed at 11/16/2020 2305  Baseline Rate (A) 145 bpm filed at 11/16/2020 2305  Variability 6-25 BPM filed at 11/16/2020 2305  Accelerations 15 x 15 filed at 11/16/2020 2305  Decelerations None filed at 11/16/2020 2305    Labs:  No results found for this or any previous visit (from the past 24 hour(s)).   Medications:  Scheduled  docusate sodium  100 mg Oral Daily   ferrous sulfate  325 mg Oral QODAY   NIFEdipine  30 mg Oral BID   prenatal vitamin w/FE, FA  1 tablet Oral Q1200   I have reviewed the patient's current medications.  ASSESSMENT: Patient Active Problem List   Diagnosis Date Noted   Chronic hypertension during pregnancy, antepartum 11/16/2020   Iron deficiency anemia during pregnancy, antepartum 11/13/2020    Severe pre-eclampsia 11/10/2020   Prenatal care insufficient 10/13/2020   Supervision of other normal pregnancy, antepartum 06/29/2020    PLAN: Continue current care with IOL planned at 34 weeks  07/01/2020 11/17/2020,10:11 AM

## 2020-11-18 LAB — COMPREHENSIVE METABOLIC PANEL
ALT: 11 U/L (ref 0–44)
AST: 15 U/L (ref 15–41)
Albumin: 2.4 g/dL — ABNORMAL LOW (ref 3.5–5.0)
Alkaline Phosphatase: 105 U/L (ref 38–126)
Anion gap: 5 (ref 5–15)
BUN: 5 mg/dL — ABNORMAL LOW (ref 6–20)
CO2: 22 mmol/L (ref 22–32)
Calcium: 8.5 mg/dL — ABNORMAL LOW (ref 8.9–10.3)
Chloride: 107 mmol/L (ref 98–111)
Creatinine, Ser: 0.59 mg/dL (ref 0.44–1.00)
GFR, Estimated: >60 mL/min (ref >60)
Glucose, Bld: 76 mg/dL (ref 70–99)
Potassium: 3.8 mmol/L (ref 3.5–5.1)
Sodium: 134 mmol/L — ABNORMAL LOW (ref 135–145)
Total Bilirubin: 0.5 mg/dL (ref 0.3–1.2)
Total Protein: 5.2 g/dL — ABNORMAL LOW (ref 6.5–8.1)

## 2020-11-18 LAB — CBC
HCT: 27.8 % — ABNORMAL LOW (ref 36.0–46.0)
Hemoglobin: 8.8 g/dL — ABNORMAL LOW (ref 12.0–15.0)
MCH: 27.5 pg (ref 26.0–34.0)
MCHC: 31.7 g/dL (ref 30.0–36.0)
MCV: 86.9 fL (ref 80.0–100.0)
Platelets: 204 10*3/uL (ref 150–400)
RBC: 3.2 MIL/uL — ABNORMAL LOW (ref 3.87–5.11)
RDW: 15.4 % (ref 11.5–15.5)
WBC: 8.6 10*3/uL (ref 4.0–10.5)
nRBC: 0.3 % — ABNORMAL HIGH (ref 0.0–0.2)

## 2020-11-18 LAB — TYPE AND SCREEN
ABO/RH(D): O POS
Antibody Screen: NEGATIVE

## 2020-11-18 NOTE — Progress Notes (Signed)
Patient ID: Carolyn Rivas, female   DOB: 11/21/2000, 20 y.o.   MRN: [id number] FACULTY PRACTICE ANTEPARTUM(COMPREHENSIVE) NOTE  Carolyn Rivas is a 20 y.o. G2P0010 at [redacted]w[redacted]d by LMP who is admitted for preeclampsia with severe features  .   Marland Kitchen   Fetal presentation is cephalic. Length of Stay:  3  Days  Subjective: No complaint Patient reports the fetal movement as active. Patient reports uterine contraction  activity as none. Patient reports  vaginal bleeding as none. Patient describes fluid per vagina as None.  Vitals:  Blood pressure (!) 145/85, pulse (!) 101, temperature 98.2 F (36.8 C), temperature source Oral, resp. rate 18, height 5\' 1"  (1.549 m), weight 75 kg, last menstrual period 03/26/2020, SpO2 100 %. Physical Examination:  General appearance - alert, well appearing, and in no distress Heart - normal rate and regular rhythm Abdomen - soft, nontender, nondistended Fundal Height:  size equals dates Cervical Exam: Not evaluated.  Extremities: extremities normal, atraumatic, no cyanosis or edema and Homans sign is negative Membranes:intact  Fetal Monitoring:   Flowsheet Row Most Recent Value  Fetal Heart Rate A   Mode External  [removed] filed at 11/17/2020 2247  Baseline Rate (A) 145 bpm filed at 11/17/2020 2247  Variability 6-25 BPM filed at 11/17/2020 2247  Accelerations 15 x 15 filed at 11/17/2020 2247  Decelerations None filed at 11/17/2020 2247    Labs:  No results found for this or any previous visit (from the past 24 hour(s)).   Medications:  Scheduled  docusate sodium  100 mg Oral Daily   ferrous sulfate  325 mg Oral QODAY   NIFEdipine  30 mg Oral BID   prenatal vitamin w/FE, FA  1 tablet Oral Q1200   I have reviewed the patient's current medications.  ASSESSMENT: Patient Active Problem List   Diagnosis Date Noted   Chronic hypertension during pregnancy, antepartum 11/16/2020   Iron deficiency anemia during pregnancy, antepartum  11/13/2020   Severe pre-eclampsia 11/10/2020   Prenatal care insufficient 10/13/2020   Supervision of other normal pregnancy, antepartum 06/29/2020    PLAN: Continue current care with IOL planned at 34 weeks  07/01/2020 11/18/2020,11:44 AM

## 2020-11-19 ENCOUNTER — Inpatient Hospital Stay (HOSPITAL_COMMUNITY)
Admission: AD | Admit: 2020-11-19 | Payer: Medicaid Other | Source: Home / Self Care | Admitting: Obstetrics & Gynecology

## 2020-11-19 ENCOUNTER — Inpatient Hospital Stay (HOSPITAL_COMMUNITY): Payer: Medicaid Other | Attending: Obstetrics & Gynecology

## 2020-11-19 ENCOUNTER — Encounter (HOSPITAL_COMMUNITY): Payer: Self-pay | Admitting: Obstetrics & Gynecology

## 2020-11-19 LAB — COMPREHENSIVE METABOLIC PANEL
ALT: 12 U/L (ref 0–44)
AST: 17 U/L (ref 15–41)
Albumin: 3 g/dL — ABNORMAL LOW (ref 3.5–5.0)
Alkaline Phosphatase: 139 U/L — ABNORMAL HIGH (ref 38–126)
Anion gap: 9 (ref 5–15)
BUN: 5 mg/dL — ABNORMAL LOW (ref 6–20)
CO2: 21 mmol/L — ABNORMAL LOW (ref 22–32)
Calcium: 9.2 mg/dL (ref 8.9–10.3)
Chloride: 106 mmol/L (ref 98–111)
Creatinine, Ser: 0.63 mg/dL (ref 0.44–1.00)
GFR, Estimated: >60 mL/min (ref >60)
Glucose, Bld: 65 mg/dL — ABNORMAL LOW (ref 70–99)
Potassium: 3.9 mmol/L (ref 3.5–5.1)
Sodium: 136 mmol/L (ref 135–145)
Total Bilirubin: 0.5 mg/dL (ref 0.3–1.2)
Total Protein: 6.4 g/dL — ABNORMAL LOW (ref 6.5–8.1)

## 2020-11-19 LAB — CBC
HCT: 33.9 % — ABNORMAL LOW (ref 36.0–46.0)
Hemoglobin: 10.9 g/dL — ABNORMAL LOW (ref 12.0–15.0)
MCH: 27.9 pg (ref 26.0–34.0)
MCHC: 32.2 g/dL (ref 30.0–36.0)
MCV: 86.7 fL (ref 80.0–100.0)
Platelets: 238 10*3/uL (ref 150–400)
RBC: 3.91 MIL/uL (ref 3.87–5.11)
RDW: 15.8 % — ABNORMAL HIGH (ref 11.5–15.5)
WBC: 10.8 10*3/uL — ABNORMAL HIGH (ref 4.0–10.5)
nRBC: 0.3 % — ABNORMAL HIGH (ref 0.0–0.2)

## 2020-11-19 MED ORDER — LACTATED RINGERS IV SOLN: 500.0000 mL | INTRAVENOUS | Status: DC | PRN

## 2020-11-19 MED ORDER — EPHEDRINE 5 MG/ML INJ: 10.0000 mg | INTRAVENOUS | Status: DC | PRN

## 2020-11-19 MED ORDER — PHENYLEPHRINE 40 MCG/ML (10ML) SYRINGE FOR IV PUSH (FOR BLOOD PRESSURE SUPPORT): 80.0000 ug | PREFILLED_SYRINGE | INTRAVENOUS | Status: DC | PRN

## 2020-11-19 MED ORDER — DIPHENHYDRAMINE HCL 50 MG/ML IJ SOLN: 12.5000 mg | INTRAMUSCULAR | Status: DC | PRN

## 2020-11-19 MED ORDER — LIDOCAINE HCL (PF) 1 % IJ SOLN: 30.0000 mL | INTRAMUSCULAR | Status: DC | PRN

## 2020-11-19 MED ORDER — OXYTOCIN BOLUS FROM INFUSION
333.0000 mL | Freq: Once | INTRAVENOUS | Status: AC
  Administered 2020-11-20: 333 mL via INTRAVENOUS

## 2020-11-19 MED ORDER — OXYCODONE-ACETAMINOPHEN 5-325 MG PO TABS: 2.0000 | ORAL_TABLET | ORAL | Status: DC | PRN

## 2020-11-19 MED ORDER — EPHEDRINE 5 MG/ML INJ
10.0000 mg | INTRAVENOUS | Status: DC | PRN
  Filled 2020-11-19: qty 5

## 2020-11-19 MED ORDER — TERBUTALINE SULFATE 1 MG/ML IJ SOLN: 0.2500 mg | Freq: Once | INTRAMUSCULAR | Status: DC | PRN

## 2020-11-19 MED ORDER — LACTATED RINGERS IV SOLN: INTRAVENOUS | Status: DC

## 2020-11-19 MED ORDER — LACTATED RINGERS IV SOLN: 500.0000 mL | Freq: Once | INTRAVENOUS | Status: DC

## 2020-11-19 MED ORDER — FENTANYL CITRATE (PF) 100 MCG/2ML IJ SOLN
50.0000 ug | INTRAMUSCULAR | Status: DC | PRN
  Administered 2020-11-19 – 2020-11-20 (×5): 100 ug via INTRAVENOUS
  Filled 2020-11-19 (×5): qty 2

## 2020-11-19 MED ORDER — ACETAMINOPHEN 325 MG PO TABS: 650.0000 mg | ORAL_TABLET | ORAL | Status: DC | PRN

## 2020-11-19 MED ORDER — OXYTOCIN-SODIUM CHLORIDE 30-0.9 UT/500ML-% IV SOLN: 2.5000 [IU]/h | INTRAVENOUS | Status: DC

## 2020-11-19 MED ORDER — OXYCODONE-ACETAMINOPHEN 5-325 MG PO TABS: 1.0000 | ORAL_TABLET | ORAL | Status: DC | PRN

## 2020-11-19 MED ORDER — ONDANSETRON HCL 4 MG/2ML IJ SOLN: 4.0000 mg | Freq: Four times a day (QID) | INTRAMUSCULAR | Status: DC | PRN

## 2020-11-19 MED ORDER — MISOPROSTOL 50MCG HALF TABLET
50.0000 ug | ORAL_TABLET | ORAL | Status: DC
  Administered 2020-11-19 – 2020-11-20 (×5): 50 ug via BUCCAL
  Filled 2020-11-19 (×5): qty 1

## 2020-11-19 MED ORDER — FENTANYL-BUPIVACAINE-NACL 0.5-0.125-0.9 MG/250ML-% EP SOLN
12.0000 mL/h | EPIDURAL | Status: DC | PRN
  Administered 2020-11-20: 12 mL/h via EPIDURAL
  Filled 2020-11-19: qty 250

## 2020-11-19 MED ORDER — SOD CITRATE-CITRIC ACID 500-334 MG/5ML PO SOLN: 30.0000 mL | ORAL | Status: DC | PRN

## 2020-11-19 NOTE — Progress Notes (Signed)
Patient ID: Carolyn Rivas, female   DOB: [birth year], 20 y.o.   MRN: [id number] FACULTY PRACTICE ANTEPARTUM(COMPREHENSIVE) NOTE  Carolyn Rivas is a 20 y.o. G2P0010 at [redacted]w[redacted]d by LMP who is admitted for preeclampsia with severe features    Fetal presentation is cephalic. Length of Stay:  4  Days  Subjective: Ready for IOL today Patient reports the fetal movement as active. Patient reports uterine contraction  activity as none. Patient reports  vaginal bleeding as none. Patient describes fluid per vagina as None.  Vitals:  Blood pressure (!) 135/95, pulse 99, temperature 98.6 F (37 C), temperature source Oral, resp. rate 18, height 5\' 1"  (1.549 m), weight 75 kg, last menstrual period 03/26/2020, SpO2 100 %. Physical Examination:  General appearance - alert, well appearing, and in no distress Heart - normal rate and regular rhythm Abdomen - soft, nontender, nondistended Fundal Height:  size equals dates Cervical Exam: Not evaluated.  Extremities: extremities normal, atraumatic, no cyanosis or edema and Homans sign is negative, no sign of DVT Membranes:intact  Fetal Monitoring:   Fetal Heart Rate A   Mode External  [applied] filed at 11/19/2020 0850  Baseline Rate (A) 140 bpm filed at 11/18/2020 2323  Variability 6-25 BPM, <5 BPM filed at 11/18/2020 2323  Accelerations 15 x 15 filed at 11/18/2020 2323  Decelerations None filed at 11/18/2020 2323    Labs:  Results for orders placed or performed during the hospital encounter of 11/09/20 (from the past 24 hour(s))  Comprehensive metabolic panel   Collection Time: 11/18/20 11:35 AM  Result Value Ref Range   Sodium 134 (L) 135 - 145 mmol/L   Potassium 3.8 3.5 - 5.1 mmol/L   Chloride 107 98 - 111 mmol/L   CO2 22 22 - 32 mmol/L   Glucose, Bld 76 70 - 99 mg/dL   BUN 5 (L) 6 - 20 mg/dL   Creatinine, Ser 11/20/20 0.44 - 1.00 mg/dL   Calcium 8.5 (L) 8.9 - 10.3 mg/dL   Total Protein 5.2 (L) 6.5 - 8.1 g/dL   Albumin 2.4 (L)  3.5 - 5.0 g/dL   AST 15 15 - 41 U/L   ALT 11 0 - 44 U/L   Alkaline Phosphatase 105 38 - 126 U/L   Total Bilirubin 0.5 0.3 - 1.2 mg/dL   GFR, Estimated 0.10 >93 mL/min   Anion gap 5 5 - 15  CBC   Collection Time: 11/18/20 11:35 AM  Result Value Ref Range   WBC 8.6 4.0 - 10.5 K/uL   RBC 3.20 (L) 3.87 - 5.11 MIL/uL   Hemoglobin 8.8 (L) 12.0 - 15.0 g/dL   HCT 11/20/20 (L) 55.7 - 32.2 %   MCV 86.9 80.0 - 100.0 fL   MCH 27.5 26.0 - 34.0 pg   MCHC 31.7 30.0 - 36.0 g/dL   RDW 02.5 42.7 - 06.2 %   Platelets 204 150 - 400 K/uL   nRBC 0.3 (H) 0.0 - 0.2 %  Type and screen [facility]   Collection Time: 11/18/20 11:35 AM  Result Value Ref Range   ABO/RH(D) O POS    Antibody Screen NEG    Sample Expiration      11/21/2020,2359 Performed at [facility], [address] [place], [place]      Medications:  Scheduled  docusate sodium  100 mg Oral Daily   ferrous sulfate  325 mg Oral QODAY   NIFEdipine  30 mg Oral BID  prenatal vitamin w/FE, FA  1 tablet Oral Q1200   I have reviewed the patient's current medications.  ASSESSMENT: Patient Active Problem List   Diagnosis Date Noted   Chronic hypertension during pregnancy, antepartum 11/16/2020   Iron deficiency anemia during pregnancy, antepartum 11/13/2020   Severe pre-eclampsia 11/10/2020   Prenatal care insufficient 10/13/2020   Supervision of other normal pregnancy, antepartum 06/29/2020    PLAN: IOL is scheduled today, Dr. Algernon Huxley in NICU was notified and approved  Scheryl Darter 11/19/2020,9:07 AM

## 2020-11-19 NOTE — Progress Notes (Signed)
Dr. Mathis Fare says pt may eat 1hr after cytotec placement if FHR reactive. Pt aware.

## 2020-11-19 NOTE — Progress Notes (Signed)
Labor Progress Note Carolyn Rivas is a 20 y.o. G2P0010 at [redacted]w[redacted]d who presented for IOL due to severe pre-eclampsia.   S: Doing okay. No major concerns per RN. Wondering if she can have intermittent monitoring.   O:  BP 132/87   Pulse (!) 104   Temp 98.2 F (36.8 C) (Oral)   Resp 18   Ht 5\' 1"  (1.549 m)   Wt 75 kg   LMP 03/26/2020   SpO2 100%   BMI 31.24 kg/m   EFM: Baseline 150 bpm, moderate variability, + accels, no decels  Toco: Every 2-4 minutes   CVE: Dilation: Closed Effacement (%): 50 Station: -3 Presentation: Vertex Exam by:: J.Cox, RN   A&P: 20 y.o. G2P0010 [redacted]w[redacted]d   #Labor: SVE largely unchanged. Additional dose of Cytotec given. Will reassess in 4 hours. Discussed importance of fetal monitoring during IOL in high risk pregnancy. Will maintain monitors for now.  #Pain: PRN #FWB: Cat 1  #GBS negative  #Pre-eclampsia with severe features: BP remains in normal to mild range. No symptoms. CBC/CMP today stable. On Procardia 30 mg BID. Will continue to monitor.   [redacted]w[redacted]d, MD 6:16 PM

## 2020-11-19 NOTE — Progress Notes (Signed)
Went to bedside to check cervix, last checked about 5 hours earlier. Last cytotec at 1737.   Patient declined cervical exam at this time, asked if I could return at midnight. She has been having contractions on the toco about every 2-3 minutes, feeling somewhat uncomfortable with them on occasion. FHT Cat 1.   Plan to return at 12.   Allayne Stack, DO

## 2020-11-19 NOTE — Progress Notes (Signed)
Lab here to draw CBC, CMP, RPR. Pt informed of need for these labs and the medical reason why. Pt finally consents to blood draw but "will not let you draw anymore blood today"

## 2020-11-19 NOTE — Progress Notes (Signed)
Pt removed FHR monitors Rn in room to replace them back on when pt ask what happened pt stated she took them off they are poking her and she doesn't like them. Explained the reason why cont fetal monitoring was necessary. Pt verbalizes understanding.

## 2020-11-19 NOTE — Progress Notes (Signed)
Labor Progress Note Carolyn Rivas is a 20 y.o. G2P0010 at [redacted]w[redacted]d who presented for IOL due to pre-eclampsia with severe features.   S: Doing well overall. Nervous about labor and delivery process. No other concerns at this time.  O:  BP (!) 134/94   Pulse 87   Temp 98.2 F (36.8 C) (Oral)   Resp 18   Ht 5\' 1"  (1.549 m)   Wt 75 kg   LMP 03/26/2020   SpO2 100%   BMI 31.24 kg/m   EFM: Baseline 150 bpm, moderate variability, + accels, no decels  Toco: No contractions  CVE: Dilation: Closed Effacement (%): Thick Station: Ballotable Presentation: Vertex Exam by:: Kanika Bungert  A&P: 20 y.o. G2P0010 [redacted]w[redacted]d   #Labor: Closed cervix. Will start induction with buccal Cytotec 50 mcg and reassess in 4 hours. #Pain: PRN #FWB: Cat 1  #GBS negative  #Pre-eclampsia with severe features: Normal to mild range pressures. No symptoms. Last CBC/CMP stable on 11/18. Will continue to monitor closely. Plan to check labs every shift. Continue Procardia 30 mg BID.   12/18, MD 1:11 PM

## 2020-11-20 ENCOUNTER — Inpatient Hospital Stay (HOSPITAL_COMMUNITY): Payer: Medicaid Other | Admitting: Anesthesiology

## 2020-11-20 DIAGNOSIS — O1414 Severe pre-eclampsia complicating childbirth: Secondary | ICD-10-CM

## 2020-11-20 DIAGNOSIS — Z3A34 34 weeks gestation of pregnancy: Secondary | ICD-10-CM

## 2020-11-20 LAB — RPR: RPR Ser Ql: NONREACTIVE

## 2020-11-20 LAB — CBC WITH DIFFERENTIAL/PLATELET
Abs Immature Granulocytes: 0.05 10*3/uL (ref 0.00–0.07)
Basophils Absolute: 0 10*3/uL (ref 0.0–0.1)
Basophils Relative: 0 %
Eosinophils Absolute: 0 10*3/uL (ref 0.0–0.5)
Eosinophils Relative: 0 %
HCT: 29.3 % — ABNORMAL LOW (ref 36.0–46.0)
Hemoglobin: 9.2 g/dL — ABNORMAL LOW (ref 12.0–15.0)
Immature Granulocytes: 0 %
Lymphocytes Relative: 10 %
Lymphs Abs: 1.1 10*3/uL (ref 0.7–4.0)
MCH: 27.9 pg (ref 26.0–34.0)
MCHC: 31.4 g/dL (ref 30.0–36.0)
MCV: 88.8 fL (ref 80.0–100.0)
Monocytes Absolute: 1.4 10*3/uL — ABNORMAL HIGH (ref 0.1–1.0)
Monocytes Relative: 13 %
Neutro Abs: 8.6 10*3/uL — ABNORMAL HIGH (ref 1.7–7.7)
Neutrophils Relative %: 77 %
Platelets: 188 10*3/uL (ref 150–400)
RBC: 3.3 MIL/uL — ABNORMAL LOW (ref 3.87–5.11)
RDW: 15.9 % — ABNORMAL HIGH (ref 11.5–15.5)
WBC: 11.2 10*3/uL — ABNORMAL HIGH (ref 4.0–10.5)
nRBC: 0.3 % — ABNORMAL HIGH (ref 0.0–0.2)

## 2020-11-20 LAB — COMPREHENSIVE METABOLIC PANEL
ALT: 9 U/L (ref 0–44)
AST: 15 U/L (ref 15–41)
Albumin: 2.4 g/dL — ABNORMAL LOW (ref 3.5–5.0)
Alkaline Phosphatase: 123 U/L (ref 38–126)
Anion gap: 6 (ref 5–15)
BUN: <5 mg/dL — ABNORMAL LOW (ref 6–20)
CO2: 23 mmol/L (ref 22–32)
Calcium: 8.1 mg/dL — ABNORMAL LOW (ref 8.9–10.3)
Chloride: 105 mmol/L (ref 98–111)
Creatinine, Ser: 0.8 mg/dL (ref 0.44–1.00)
GFR, Estimated: >60 mL/min (ref >60)
Glucose, Bld: 91 mg/dL (ref 70–99)
Potassium: 3.9 mmol/L (ref 3.5–5.1)
Sodium: 134 mmol/L — ABNORMAL LOW (ref 135–145)
Total Bilirubin: 0.4 mg/dL (ref 0.3–1.2)
Total Protein: 5.3 g/dL — ABNORMAL LOW (ref 6.5–8.1)

## 2020-11-20 MED ORDER — MAGNESIUM SULFATE 40 GM/1000ML IV SOLN
2.0000 g/h | INTRAVENOUS | Status: DC
  Administered 2020-11-20 (×2): 2 g/h via INTRAVENOUS
  Filled 2020-11-20 (×2): qty 1000

## 2020-11-20 MED ORDER — MAGNESIUM SULFATE BOLUS VIA INFUSION
4.0000 g | Freq: Once | INTRAVENOUS | Status: AC
  Administered 2020-11-20: 4 g via INTRAVENOUS
  Filled 2020-11-20: qty 1000

## 2020-11-20 MED ORDER — LIDOCAINE HCL (PF) 1 % IJ SOLN
INTRAMUSCULAR | Status: DC | PRN
  Administered 2020-11-20: 6 mL via EPIDURAL

## 2020-11-20 MED ORDER — TERBUTALINE SULFATE 1 MG/ML IJ SOLN: 0.2500 mg | Freq: Once | INTRAMUSCULAR | Status: DC | PRN

## 2020-11-20 MED ORDER — OXYTOCIN-SODIUM CHLORIDE 30-0.9 UT/500ML-% IV SOLN
1.0000 m[IU]/min | INTRAVENOUS | Status: DC
  Administered 2020-11-20: 2 m[IU]/min via INTRAVENOUS
  Filled 2020-11-20: qty 500

## 2020-11-20 MED ORDER — LABETALOL HCL 5 MG/ML IV SOLN: 80.0000 mg | INTRAVENOUS | Status: DC | PRN

## 2020-11-20 MED ORDER — TRANEXAMIC ACID-NACL 1000-0.7 MG/100ML-% IV SOLN: 1000.0000 mg | INTRAVENOUS | Status: AC

## 2020-11-20 MED ORDER — LABETALOL HCL 5 MG/ML IV SOLN: 40.0000 mg | INTRAVENOUS | Status: DC | PRN

## 2020-11-20 MED ORDER — LABETALOL HCL 5 MG/ML IV SOLN: 20.0000 mg | INTRAVENOUS | Status: DC | PRN

## 2020-11-20 MED ORDER — TRANEXAMIC ACID-NACL 1000-0.7 MG/100ML-% IV SOLN
INTRAVENOUS | Status: AC
  Administered 2020-11-20: 1000 mg via INTRAVENOUS
  Filled 2020-11-20: qty 100

## 2020-11-20 MED ORDER — HYDRALAZINE HCL 20 MG/ML IJ SOLN: 10.0000 mg | INTRAMUSCULAR | Status: DC | PRN

## 2020-11-20 NOTE — Progress Notes (Signed)
Labor Progress Note Carolyn Rivas is a 20 y.o. G2P0010 at [redacted]w[redacted]d presented for IOL due to preE with severe features (BP)  S: Feeling pressure and discomfort. Says she feels "terrible" but did not further elaborate.  O:  BP (!) 137/98   Pulse 91   Temp 98.8 F (37.1 C) (Axillary)   Resp 18   Ht 5\' 1"  (1.549 m)   Wt 75 kg   LMP 03/26/2020   SpO2 100%   BMI 31.24 kg/m   CVE: Dilation:  (refused) Effacement (%): 70 Station: -2 Presentation: Vertex Exam by:: dr [email or web address]   A&P: 20 y.o. G2P0010 [redacted]w[redacted]d IOL with preE with SF #Labor: Cook in place. SROM at 1230. On pitocin. #Pain: Epidural #FWB: Cat 1 #GBS negative Pre-eclampsia: On IV Mag, Procardia 30 BID, hydralazine PRN. BPs elevated; most recent: 149/100, 137/98. Will monitor for severe- range  [redacted]w[redacted]d, DO 9:36 PM

## 2020-11-20 NOTE — Progress Notes (Signed)
OB/GYN Faculty Practice: Labor Progress Note  Subjective: Pt resting comfortably in bed with epidural  Objective: BP 137/90   Pulse 93   Temp 98 F (36.7 C) (Axillary)   Resp 20   Ht 5\' 1"  (1.549 m)   Wt 75 kg   LMP 03/26/2020   SpO2 100%   BMI 31.24 kg/m  Gen: no acute complaints  Cook balloon placed without difficulty- 60cc Dilation: 2 Effacement (%): 70 Station: -2 Presentation: Vertex Exam by:: dr [email or web address]  Assessment and Plan: 20 y.o. G2P0010 [redacted]w[redacted]d for IOL due to preeclampsia with severe features  Labor: s/p cytotec, Cook placed, plan to transition to Pitocin -- pain control: continue epidural  Fetal Well-Being: Cat. I  -Preeclampsia- BP as above, continue IV Mag -GBS neg   [redacted]w[redacted]d, DO Attending Obstetrician & Gynecologist, [facility], [facility]

## 2020-11-20 NOTE — Progress Notes (Signed)
Carolyn Rivas is a 20 y.o. G2P0010 at [redacted]w[redacted]d admitted for induction of labor due to preeclampsia with SF (blood pressures) .  Subjective: Feeling like her water broke, feeling more pressure and more discomfort.   Ok with cervical check now. Would like Fentanyl for pain management  Objective: BP (!) 157/98   Pulse 99   Temp 98 F (36.7 C) (Oral)   Resp 18   Ht 5\' 1"  (1.549 m)   Wt 75 kg   LMP 03/26/2020   SpO2 100%   BMI 31.24 kg/m  I/O last 3 completed shifts: In: 505.1 [I.V.:505.1] Out: -  Total I/O In: 494 [P.O.:150; I.V.:344] Out: 1350 [Urine:1350]  FHT:  FHR: 135 bpm, variability: moderate,  accelerations:  present,  decelerations:  Absent UC:   regular, every 2-3 minutes (palpated while at bedside) SVE:   Dilation: 1 Effacement (%): 50 Station: -3 Exam by:: dr daas  Labs: Lab Results  Component Value Date   WBC 11.2 (H) 11/20/2020   HGB 9.2 (L) 11/20/2020   HCT 29.3 (L) 11/20/2020   MCV 88.8 11/20/2020   PLT 188 11/20/2020    Assessment / Plan: Admitted for induction of labor due to preeclampsia with SF (blood pressures) . Exam very uncomfortable for patient despite IV Fentanyl. Cervix still posterior and likely 0.5 to 1 cm although due to patient discomfort stopped exam and could not fully assess.  - Discussed pain management options for patient. Offered early epidural and patient thinking about it.  - Given cervix still posterior will give another cytotec when due (4 hrs after last dose). Also clear fluid on glove during check confirming SROM.  #PreE w/SF No severe range BP this shift although last check 157/98 while painfully contracted. - 12PM labs with normal Plt and LFT - Continue to monitor and continue Procardia BID - Next set of labs at midnight   11/22/2020 11/20/2020, 1:55 PM

## 2020-11-20 NOTE — Progress Notes (Signed)
Patient ID: Carolyn Rivas, female   DOB: 2000-04-16, 20 y.o.   MRN: [id number]  Pitocin recently off due to FHR variables- had been on 62mu/min; cervical foley recently out; pt comfortable w epidural and reluctantly agrees to exam due to variables; mag sulfate gtt going  BPs 137/98, 149/100 FHR 135, +early decels, avg LTV Ctx q 2-3 mins Cx C/C/vtx +3  IUP@34 .1wk Severe pre-e End 1st stage  Will begin pushing w ctx after delivery items set up Anticipate vag del with NICU team present  Arabella Merles CNM 11/20/2020 11:16 PM

## 2020-11-20 NOTE — Progress Notes (Signed)
Carolyn Rivas is a 20 y.o. G2P0010 at [redacted]w[redacted]d admitted for induction of labor due to preE with SF.   Now s/p epidural. Thinks feels more comfortable with epidural. Reports she does not want cervix checked right now. Ok with doing another cytotec now.  Last cytotec at 0957. Plan was to do another one after last check but patient very uncomfortable with contractions and got epidural and deferred cytotec until now. Now 2 hrs past last check. BSUS used to confirm vertex.   - Cytotec #5 given - discussed with patient that we recommend check at 4 hrs from  this cytotec to further assess cervix and decide on next steps. Since she has epidural now likely less uncomfortable than checks prior to epidural.   Warner Mccreedy, MD, MPH OB Fellow, Faculty Practice

## 2020-11-20 NOTE — Progress Notes (Signed)
Labor Progress Note Carolyn Rivas is a 20 y.o. G2P0010 at [redacted]w[redacted]d presented for IOL due to pre-eclampsia with severe features.   S: Presented to bedside for cervical check. Opted to do IV fentanyl prior to check. During evaluation, patient appeared frustrated with depressed affect. When asked if something was bothering her, she replied "I'm just going to keep my mouth shut." Offered supportive listening, however patient not interested in discussing any further. Expresses irritation that she has not been able to nap, feeling tired. Her sister is at bedside for support.   O:  BP 134/80   Pulse 96   Temp 98.9 F (37.2 C) (Oral)   Resp 18   Ht 5\' 1"  (1.549 m)   Wt 75 kg   LMP 03/26/2020   SpO2 100%   BMI 31.24 kg/m  EFM: 145/mod/none/none  CVE: Dilation: Closed Effacement (%): 50 Station: -3 Presentation: Vertex Exam by:: Dr. [email or web address]   A&P: 20 y.o. G2P0010 [redacted]w[redacted]d  #Labor: After IV fent with verbal consent, performed limited cervical exam due to patient intolerance. Feels to be still closed in the internal os, however challenging exam. Will give additional buccal cytotec. Plan to recheck in the next 4-5 hours, hopefully for change and ability for her to rest.  #Pain: PRN  #FWB: Cat I  #GBS negative   #Pre-eclampsia with severe features: No current concerning symptoms. BP slowly increasing, reaching close back to severe ranges. Will restart IV Mg, bolus and infusion orders placed. Pre-e labs WNL recently, recheck later this morning.   #Depressed affect: Unfortunately appears to be frustrated, unclear circumstances. Offered support, however not interested in discussing at this time.   [redacted]w[redacted]d, DO 1:02 AM

## 2020-11-20 NOTE — Anesthesia Procedure Notes (Signed)
Epidural Patient location during procedure: OB Start time: 11/20/2020 2:56 PM End time: 11/20/2020 3:06 PM  Staffing Anesthesiologist: Mellody Dance, MD Performed: anesthesiologist   Preanesthetic Checklist Completed: patient identified, IV checked, site marked, risks and benefits discussed, monitors and equipment checked, pre-op evaluation and timeout performed  Epidural Patient position: sitting Prep: DuraPrep Patient monitoring: heart rate, cardiac monitor, continuous pulse ox and blood pressure Approach: midline Location: L2-L3 Injection technique: LOR saline  Needle:  Needle type: Tuohy  Needle gauge: 17 G Needle length: 9 cm Needle insertion depth: 5 cm Catheter type: closed end flexible Catheter size: 20 Guage Catheter at skin depth: 10 cm Test dose: negative and Other  Assessment Events: blood not aspirated, injection not painful, no injection resistance and negative IV test  Additional Notes Informed consent obtained prior to proceeding including risk of failure, 1% risk of PDPH, risk of minor discomfort and bruising.  Discussed rare but serious complications including epidural abscess, permanent nerve injury, epidural hematoma.  Discussed alternatives to epidural analgesia and patient desires to proceed.  Timeout performed pre-procedure verifying patient name, procedure, and platelet count.  Patient tolerated procedure well.

## 2020-11-20 NOTE — Progress Notes (Signed)
Krisanne Lich is a 20 y.o. G2P0010 at [redacted]w[redacted]d admitted for induction of labor due to preE with SF(BP).  Subjective: Reports she  feeling some intermittent cramping. Denies headaches. Reports she would like to defer a cervical check and opts for BSUS to re-confirm vertex prior to next cytotec.   Objective: BP 129/78   Pulse 100   Temp 98.4 F (36.9 C) (Oral)   Resp 18   Ht 5\' 1"  (1.549 m)   Wt 75 kg   LMP 03/26/2020   SpO2 100%   BMI 31.24 kg/m  I/O last 3 completed shifts: In: 505.1 [I.V.:505.1] Out: -  Total I/O In: 100 [I.V.:100] Out: 850 [Urine:850]  FHT:  FHR: 145 bpm, variability: moderate,  accelerations:  Present,  decelerations:  Absent UC:   irregular SVE:   Dilation: Closed Effacement (%): 50 Station: -3 Exam by:: Dr. [email or web address]  Labs: Lab Results  Component Value Date   WBC 10.8 (H) 11/19/2020   HGB 10.9 (L) 11/19/2020   HCT 33.9 (L) 11/19/2020   MCV 86.7 11/19/2020   PLT 238 11/19/2020    Assessment / Plan: Induction of labor due to preE with SF in early stages of IOL  Labor: S/p Cytotec x 3. Last dose 0100. At this time defers cervical exam. Confirmed persistent vertex presentation with BSUS. Will do another buccal cytotec 11/21/2020 once contraction pattern spaces out or if not painfully contracting comfortably resting even with contractions on monitor.   Offered Fentanyl with next cervix check and patient amenable. Preeclampsia:   Vitals stable and BP now normotensive. Will continue Mg and PO Procardia BID. F/up 12 PM labs  Fetal Wellbeing:  Category I currently, some periods of minimal variability in setting of Mg. No decels, Some accels so overall reassuring Pain Control:  IV fentanyl as needed I/D:   GBS neg   11/20/2020, 9:02 AM

## 2020-11-20 NOTE — Anesthesia Preprocedure Evaluation (Signed)
Anesthesia Evaluation  Patient identified by MRN, date of birth, ID band Patient awake    Reviewed: Allergy & Precautions, H&P , NPO status , Patient's Chart, lab work & pertinent test results  Airway Mallampati: II  TM Distance: >3 FB Neck ROM: Full    Dental no notable dental hx.    Pulmonary neg pulmonary ROS,    Pulmonary exam normal breath sounds clear to auscultation       Cardiovascular hypertension (preeclampsia), Normal cardiovascular exam Rhythm:Regular Rate:Normal     Neuro/Psych negative neurological ROS  negative psych ROS   GI/Hepatic negative GI ROS, Neg liver ROS,   Endo/Other  negative endocrine ROS  Renal/GU negative Renal ROS  negative genitourinary   Musculoskeletal negative musculoskeletal ROS (+)   Abdominal   Peds negative pediatric ROS (+)  Hematology  (+) anemia ,   Anesthesia Other Findings Pitting edema on back and legs  Reproductive/Obstetrics (+) Pregnancy                             Anesthesia Physical Anesthesia Plan  ASA: 3  Anesthesia Plan: Epidural   Post-op Pain Management:    Induction:   PONV Risk Score and Plan: 2 and Treatment may vary due to age or medical condition  Airway Management Planned: Natural Airway  Additional Equipment:   Intra-op Plan:   Post-operative Plan:   Informed Consent: I have reviewed the patients History and Physical, chart, labs and discussed the procedure including the risks, benefits and alternatives for the proposed anesthesia with the patient or authorized representative who has indicated his/her understanding and acceptance.       Plan Discussed with: Anesthesiologist  Anesthesia Plan Comments:         Anesthesia Quick Evaluation

## 2020-11-20 NOTE — Discharge Summary (Signed)
Postpartum Discharge Summary     Patient Name: Carolyn Rivas DOB: [birth year] MRN: [id number]  Date of admission: 11/09/2020 Delivery date:11/20/2020  Delivering provider: Serita Grammes D  Date of discharge: 11/22/2020  Admitting diagnosis: Chronic hypertension during pregnancy, antepartum [O10.919] Intrauterine pregnancy: [redacted]w[redacted]d    Secondary diagnosis:  Active Problems:   Severe pre-eclampsia   Iron deficiency anemia during pregnancy, antepartum   Chronic hypertension during pregnancy, antepartum   Anemia of mother in pregnancy, postpartum condition  Additional problems: none    Discharge diagnosis: Preterm Pregnancy Delivered, Preeclampsia (severe), and Anemia                                              Post partum procedures: mag sulfate x 24h PP Augmentation: Pitocin, Cytotec, and IP Foley Complications: None  Hospital course: Induction of Labor With Vaginal Delivery   20y.o. yo G2P0010 at 355w1das admitted to the hospital 11/09/2020  at 32.4wks for antepartum care.  Indication for induction:  severe pre-e as dx by severe range BPs and proteinuria with antenatal admission for close monitoring and induction of labor at 34.0wks  .  Patient had a labor course remarkable for continuous mag sulfate for seizure ppx as well as Procardia to keep BPs controlled. Membrane Rupture Time/Date: 12:35 PM ,11/20/2020   Delivery Method:Vaginal, Spontaneous  Episiotomy: None  Lacerations:  None  Details of delivery can be found in separate delivery note.  Patient received intrapartum and postpartum magnesium sulfate for eclampsia prophylaxis as per protocol. BP control was obtained with Procardia XL, there were no further immediate complications.  She was placed on Lasix for diuresis.  Otherwise, patient had a routine postpartum course. She is ambulating, tolerating a regular diet, passing flatus, and urinating well. Patient is discharged home in stable condition on 11/22/2020, and will  follow up in the office for BP check in one week.  Newborn Data: Birth date:11/20/2020  Birth time:11:31 PM  Gender:Female  Living status:Living  Apgars:7 ,9  Weight:1890 g (4lb 2.7oz)  Magnesium Sulfate received: Yes: Seizure prophylaxis BMZ received: Yes Rhophylac:N/A MMR:N/A T-DaP: ordered postpartum Flu: No Transfusion:No  Physical exam  Blood pressure 132/77, pulse (!) 105, temperature 98.6 F (37 C), temperature source Oral, resp. rate 18, height _0  (1.549 m), weight 75 kg, last menstrual period 03/26/2020, SpO2 99 %, unknown if currently breastfeeding. General: alert and no distress Lochia: appropriate Uterine Fundus: firm Incision: N/A DVT Evaluation: No evidence of DVT seen on physical exam. Negative Homan's sign. No cords or calf tenderness. Labs: Lab Results  Component Value Date   WBC 15.0 (H) 11/21/2020   HGB 11.1 (L) 11/21/2020   HCT 34.8 (L) 11/21/2020   MCV 87.9 11/21/2020   PLT 235 11/21/2020   CMP Latest Ref Rng & Units 11/21/2020  Glucose 70 - 99 mg/dL 101(H)  BUN 6 - 20 mg/dL <5(L)  Creatinine 0.44 - 1.00 mg/dL 0.86  Sodium 135 - 145 mmol/L 132(L)  Potassium 3.5 - 5.1 mmol/L 4.2  Chloride 98 - 111 mmol/L 103  CO2 22 - 32 mmol/L 22  Calcium 8.9 - 10.3 mg/dL 7.9(L)  Total Protein 6.5 - 8.1 g/dL 6.1(L)  Total Bilirubin 0.3 - 1.2 mg/dL 0.4  Alkaline Phos 38 - 126 U/L 135(H)  AST 15 - 41 U/L 21  ALT 0 - 44 U/L 11   Edinburgh Score:  No flowsheet data found.   After visit meds:  Allergies as of 11/22/2020   No Known Allergies      Medication List     TAKE these medications    ferrous sulfate 325 (65 FE) MG tablet Commonly known as: FerrouSul Take 1 tablet (325 mg total) by mouth every other day.   furosemide 20 MG tablet Commonly known as: LASIX Take 1 tablet (20 mg total) by mouth 2 (two) times daily for 5 days.   ibuprofen 600 MG tablet Commonly known as: ADVIL Take 1 tablet (600 mg total) by mouth every 6 (six) hours as  needed for moderate pain or cramping.   NIFEdipine 30 MG 24 hr tablet Commonly known as: ADALAT CC Take 1 tablet (30 mg total) by mouth 2 (two) times daily.   senna-docusate 8.6-50 MG tablet Commonly known as: Senokot-S Take 1-2 tablets by mouth at bedtime as needed for mild constipation or moderate constipation.         Discharge home in stable condition Infant Feeding: Bottle and Breast Infant Disposition:NICU Discharge instruction: per After Visit Summary and Postpartum booklet. Activity: Advance as tolerated. Pelvic rest for 6 weeks.  Diet: routine diet Future Appointments: Future Appointments  Date Time Provider [place]  11/28/2020  3:00 PM [facility]  12/28/2020  2:15 PM Gabriel Carina, CNM [facility]   Follow up Visit:  Myrtis Ser, CNM  P [facility] Admin Pool Please schedule this patient for Postpartum visit in: 1wk BP check; 6wks with the following provider: Any provider  In-Person  For C/S patients schedule nurse incision check in weeks 2 weeks: no  High risk pregnancy complicated by: severe pre-e  Delivery mode:  SVD  Anticipated Birth Control:  other/unsure  PP Procedures needed: BP check 1wk  Schedule Integrated [place] visit: no   11/22/2020 Verita Schneiders, MD

## 2020-11-21 ENCOUNTER — Encounter (HOSPITAL_COMMUNITY): Payer: Self-pay | Admitting: Obstetrics & Gynecology

## 2020-11-21 LAB — COMPREHENSIVE METABOLIC PANEL
ALT: 11 U/L (ref 0–44)
AST: 21 U/L (ref 15–41)
Albumin: 2.7 g/dL — ABNORMAL LOW (ref 3.5–5.0)
Alkaline Phosphatase: 135 U/L — ABNORMAL HIGH (ref 38–126)
Anion gap: 7 (ref 5–15)
BUN: <5 mg/dL — ABNORMAL LOW (ref 6–20)
CO2: 22 mmol/L (ref 22–32)
Calcium: 7.9 mg/dL — ABNORMAL LOW (ref 8.9–10.3)
Chloride: 103 mmol/L (ref 98–111)
Creatinine, Ser: 0.86 mg/dL (ref 0.44–1.00)
GFR, Estimated: >60 mL/min (ref >60)
Glucose, Bld: 101 mg/dL — ABNORMAL HIGH (ref 70–99)
Potassium: 4.2 mmol/L (ref 3.5–5.1)
Sodium: 132 mmol/L — ABNORMAL LOW (ref 135–145)
Total Bilirubin: 0.4 mg/dL (ref 0.3–1.2)
Total Protein: 6.1 g/dL — ABNORMAL LOW (ref 6.5–8.1)

## 2020-11-21 LAB — CBC
HCT: 34.8 % — ABNORMAL LOW (ref 36.0–46.0)
Hemoglobin: 11.1 g/dL — ABNORMAL LOW (ref 12.0–15.0)
MCH: 28 pg (ref 26.0–34.0)
MCHC: 31.9 g/dL (ref 30.0–36.0)
MCV: 87.9 fL (ref 80.0–100.0)
Platelets: 235 10*3/uL (ref 150–400)
RBC: 3.96 MIL/uL (ref 3.87–5.11)
RDW: 16.2 % — ABNORMAL HIGH (ref 11.5–15.5)
WBC: 15 10*3/uL — ABNORMAL HIGH (ref 4.0–10.5)
nRBC: 0 % (ref 0.0–0.2)

## 2020-11-21 MED ORDER — IBUPROFEN 600 MG PO TABS
600.0000 mg | ORAL_TABLET | Freq: Four times a day (QID) | ORAL | Status: DC
  Administered 2020-11-21 (×3): 600 mg via ORAL
  Filled 2020-11-21 (×3): qty 1

## 2020-11-21 MED ORDER — WITCH HAZEL-GLYCERIN EX PADS: 1.0000 "application " | MEDICATED_PAD | CUTANEOUS | Status: DC | PRN

## 2020-11-21 MED ORDER — SENNOSIDES-DOCUSATE SODIUM 8.6-50 MG PO TABS
2.0000 | ORAL_TABLET | Freq: Every day | ORAL | Status: DC
  Administered 2020-11-21 – 2020-11-22 (×2): 2 via ORAL
  Filled 2020-11-21 (×2): qty 2

## 2020-11-21 MED ORDER — MEASLES, MUMPS & RUBELLA VAC IJ SOLR: 0.5000 mL | Freq: Once | INTRAMUSCULAR | Status: DC

## 2020-11-21 MED ORDER — ACETAMINOPHEN 325 MG PO TABS: 650.0000 mg | ORAL_TABLET | ORAL | Status: DC | PRN

## 2020-11-21 MED ORDER — DIBUCAINE (PERIANAL) 1 % EX OINT: 1.0000 "application " | TOPICAL_OINTMENT | CUTANEOUS | Status: DC | PRN

## 2020-11-21 MED ORDER — COCONUT OIL OIL: 1.0000 "application " | TOPICAL_OIL | Status: DC | PRN

## 2020-11-21 MED ORDER — LACTATED RINGERS IV SOLN: INTRAVENOUS | Status: DC

## 2020-11-21 MED ORDER — MAGNESIUM SULFATE 40 GM/1000ML IV SOLN
2.0000 g/h | INTRAVENOUS | Status: AC
  Filled 2020-11-21: qty 1000

## 2020-11-21 MED ORDER — MEDROXYPROGESTERONE ACETATE 150 MG/ML IM SUSP
150.0000 mg | INTRAMUSCULAR | Status: AC | PRN
  Administered 2020-11-22: 150 mg via INTRAMUSCULAR
  Filled 2020-11-21: qty 1

## 2020-11-21 MED ORDER — PRENATAL MULTIVITAMIN CH
1.0000 | ORAL_TABLET | Freq: Every day | ORAL | Status: DC
  Administered 2020-11-22: 1 via ORAL
  Filled 2020-11-21 (×2): qty 1

## 2020-11-21 MED ORDER — DIPHENHYDRAMINE HCL 25 MG PO CAPS: 25.0000 mg | ORAL_CAPSULE | Freq: Four times a day (QID) | ORAL | Status: DC | PRN

## 2020-11-21 MED ORDER — SIMETHICONE 80 MG PO CHEW: 80.0000 mg | CHEWABLE_TABLET | ORAL | Status: DC | PRN

## 2020-11-21 MED ORDER — TETANUS-DIPHTH-ACELL PERTUSSIS 5-2.5-18.5 LF-MCG/0.5 IM SUSY
0.5000 mL | PREFILLED_SYRINGE | Freq: Once | INTRAMUSCULAR | Status: DC
  Filled 2020-11-21: qty 0.5

## 2020-11-21 MED ORDER — BENZOCAINE-MENTHOL 20-0.5 % EX AERO
1.0000 "application " | INHALATION_SPRAY | CUTANEOUS | Status: DC | PRN
  Administered 2020-11-21: 1 via TOPICAL
  Filled 2020-11-21: qty 56

## 2020-11-21 MED ORDER — ONDANSETRON HCL 4 MG PO TABS: 4.0000 mg | ORAL_TABLET | ORAL | Status: DC | PRN

## 2020-11-21 MED ORDER — ONDANSETRON HCL 4 MG/2ML IJ SOLN: 4.0000 mg | INTRAMUSCULAR | Status: DC | PRN

## 2020-11-21 NOTE — Plan of Care (Signed)
Oriented to room and routine. Vital signs stable, denies headache,blurred vision,or RUQ pain. Reflexes 2+ no clonus. Vaginal bleeding normal.Magnesium drip at 2 GM running.

## 2020-11-21 NOTE — Lactation Note (Signed)
This note was copied from a baby's chart.  NICU Lactation Consultation Note  Patient Name: Carolyn Rivas ZJQBH'A Date: 11/21/2020 Age:20 hours   Subjective Reason for consult: NICU baby; Initial assessment LC spoke briefly with mother in NICU as she was holding her baby. Mother's RN from [facility] was present. Per RN, mother does not want to pump until after she eats. RN will set up pump in mom's room when mom is ready. Mom endorses she plans to breast and bottle feed.   Mother with flat affect. She was videoing herself holding baby and did not engage in conversation with me. RN was ready to transport her back to [facility]. [place] notified RN and mother that I will plan a f/u visit this afternoon.   Objective Infant data: Mother's Current Feeding Choice: Breast Milk and Formula  Infant feeding assessment Scale for Readiness: 3     Maternal data: G2P0111  Vaginal, Spontaneous  Pumping frequency: Mother has not initiated pumping yet  Assessment Maternal:  Intervention/Plan Tools: Pump (pump and kit in mother's room)  Plan: NICU Follow-up type: New admission follow up (Complete initial assessment and education)    Gwynne Edinger 11/21/2020, 11:30 AM

## 2020-11-21 NOTE — Progress Notes (Signed)
Patient screened out for psychosocial assessment since none of the following apply: °Psychosocial stressors documented in mother or baby's chart °Gestation less than 32 weeks °Code at delivery  °Infant with anomalies °Please contact the Clinical Social Worker if specific needs arise, by MOB's request, or if MOB scores greater than 9/yes to question 10 on Edinburgh Postpartum Depression Screen. ° °Josph Norfleet Boyd-Gilyard, MSW, LCSW °Clinical Social Work °[phone] °  °

## 2020-11-21 NOTE — Anesthesia Postprocedure Evaluation (Signed)
Anesthesia Post Note  Patient: Carolyn Rivas  Procedure(s) Performed: AN AD HOC LABOR EPIDURAL     Patient location during evaluation: Mother Baby Anesthesia Type: Epidural Level of consciousness: awake and alert Pain management: pain level controlled Vital Signs Assessment: post-procedure vital signs reviewed and stable Respiratory status: spontaneous breathing, nonlabored ventilation and respiratory function stable Cardiovascular status: stable Postop Assessment: no headache, no backache and epidural receding Anesthetic complications: no   No notable events documented.  Last Vitals:  Vitals:   11/21/20 0243 11/21/20 0544  BP: (!) 140/91 133/85  Pulse: 89 91  Resp: 16 15  Temp: 36.8 C 36.9 C  SpO2: 100% 100%    Last Pain:  Vitals:   11/21/20 0700  TempSrc:   PainSc: 0-No pain   Pain Goal: Patients Stated Pain Goal: 3 (effective) (11/21/20 0700)                 Fanny Dance

## 2020-11-21 NOTE — Progress Notes (Signed)
POSTPARTUM PROGRESS NOTE  PPD #1  Subjective:  Carolyn Rivas is a 20 y.o. [phone] s/p NSVD at [redacted]w[redacted]d. Today she notes no acute complaints- resting comfortably in bed. She denies any problems with ambulating, voiding or po intake. Denies nausea or vomiting. She has not yet passed flatus, no BM.  Pain is controlled.  Lochia moderate Denies fever/chills/chest pain/SOB.  no HA, no blurry vision, no RUQ pain  Objective: Blood pressure 133/85, pulse 91, temperature 98.5 F (36.9 C), temperature source Oral, resp. rate 15, height 5\' 1"  (1.549 m), weight 75 kg, last menstrual period 03/26/2020, SpO2 100 %, unknown if currently breastfeeding.  Physical Exam:  General: alert, cooperative and no distress Chest: no respiratory distress Heart: regular rate and rhythm Abdomen: soft, nontender Uterine Fundus: firm @ umbilicus DVT Evaluation: No calf swelling or tenderness Extremities: 1+ edema Skin: warm, dry  Results for orders placed or performed during the hospital encounter of 11/09/20 (from the past 24 hour(s))  CBC with Differential/Platelet     Status: Abnormal   Collection Time: 11/20/20 12:10 PM  Result Value Ref Range   WBC 11.2 (H) 4.0 - 10.5 K/uL   RBC 3.30 (L) 3.87 - 5.11 MIL/uL   Hemoglobin 9.2 (L) 12.0 - 15.0 g/dL   HCT 11/22/20 (L) 01.7 - 51.0 %   MCV 88.8 80.0 - 100.0 fL   MCH 27.9 26.0 - 34.0 pg   MCHC 31.4 30.0 - 36.0 g/dL   RDW 25.8 (H) 52.7 - 78.2 %   Platelets 188 150 - 400 K/uL   nRBC 0.3 (H) 0.0 - 0.2 %   Neutrophils Relative % 77 %   Neutro Abs 8.6 (H) 1.7 - 7.7 K/uL   Lymphocytes Relative 10 %   Lymphs Abs 1.1 0.7 - 4.0 K/uL   Monocytes Relative 13 %   Monocytes Absolute 1.4 (H) 0.1 - 1.0 K/uL   Eosinophils Relative 0 %   Eosinophils Absolute 0.0 0.0 - 0.5 K/uL   Basophils Relative 0 %   Basophils Absolute 0.0 0.0 - 0.1 K/uL   Immature Granulocytes 0 %   Abs Immature Granulocytes 0.05 0.00 - 0.07 K/uL  Comprehensive metabolic panel     Status:  Abnormal   Collection Time: 11/20/20 12:10 PM  Result Value Ref Range   Sodium 134 (L) 135 - 145 mmol/L   Potassium 3.9 3.5 - 5.1 mmol/L   Chloride 105 98 - 111 mmol/L   CO2 23 22 - 32 mmol/L   Glucose, Bld 91 70 - 99 mg/dL   BUN <5 (L) 6 - 20 mg/dL   Creatinine, Ser 11/22/20 0.44 - 1.00 mg/dL   Calcium 8.1 (L) 8.9 - 10.3 mg/dL   Total Protein 5.3 (L) 6.5 - 8.1 g/dL   Albumin 2.4 (L) 3.5 - 5.0 g/dL   AST 15 15 - 41 U/L   ALT 9 0 - 44 U/L   Alkaline Phosphatase 123 38 - 126 U/L   Total Bilirubin 0.4 0.3 - 1.2 mg/dL   GFR, Estimated 5.36 >14 mL/min   Anion gap 6 5 - 15  Comprehensive metabolic panel     Status: Abnormal   Collection Time: 11/21/20  1:04 AM  Result Value Ref Range   Sodium 132 (L) 135 - 145 mmol/L   Potassium 4.2 3.5 - 5.1 mmol/L   Chloride 103 98 - 111 mmol/L   CO2 22 22 - 32 mmol/L   Glucose, Bld 101 (H) 70 - 99 mg/dL   BUN <5 (  L) 6 - 20 mg/dL   Creatinine, Ser 8.88 0.44 - 1.00 mg/dL   Calcium 7.9 (L) 8.9 - 10.3 mg/dL   Total Protein 6.1 (L) 6.5 - 8.1 g/dL   Albumin 2.7 (L) 3.5 - 5.0 g/dL   AST 21 15 - 41 U/L   ALT 11 0 - 44 U/L   Alkaline Phosphatase 135 (H) 38 - 126 U/L   Total Bilirubin 0.4 0.3 - 1.2 mg/dL   GFR, Estimated >91 >69 mL/min   Anion gap 7 5 - 15  CBC     Status: Abnormal   Collection Time: 11/21/20  1:04 AM  Result Value Ref Range   WBC 15.0 (H) 4.0 - 10.5 K/uL   RBC 3.96 3.87 - 5.11 MIL/uL   Hemoglobin 11.1 (L) 12.0 - 15.0 g/dL   HCT 45.0 (L) 38.8 - 82.8 %   MCV 87.9 80.0 - 100.0 fL   MCH 28.0 26.0 - 34.0 pg   MCHC 31.9 30.0 - 36.0 g/dL   RDW 00.3 (H) 49.1 - 79.1 %   Platelets 235 150 - 400 K/uL   nRBC 0.0 0.0 - 0.2 %    Assessment/Plan: Carolyn Rivas is a 20 y.o. [phone] s/p NSVD at [redacted]w[redacted]d PPD#1 complicated by: 1) Preeclampsia with severe features -Currently on Mag x 24hr -BP controlled, continue Procardia bid -currently asymptomatic, labs have been stable  2) continue routine postpartum care  Contraception:  undecided Feeding: breast/bottle  Dispo: continue with postpartum care as outlined above   LOS: 6 days   Myna Hidalgo, DO Faculty Attending, [facility] 11/21/2020, 7:23 AM

## 2020-11-22 ENCOUNTER — Other Ambulatory Visit (HOSPITAL_COMMUNITY): Payer: Self-pay

## 2020-11-22 DIAGNOSIS — O9081 Anemia of the puerperium: Secondary | ICD-10-CM

## 2020-11-22 LAB — SURGICAL PATHOLOGY

## 2020-11-22 MED ORDER — FERROUS SULFATE 325 (65 FE) MG PO TABS
325.0000 mg | ORAL_TABLET | ORAL | 3 refills | Status: DC
  Filled 2020-11-22: qty 30, 60d supply, fill #0

## 2020-11-22 MED ORDER — FUROSEMIDE 40 MG PO TABS: 20.0000 mg | ORAL_TABLET | Freq: Two times a day (BID) | ORAL | Status: DC

## 2020-11-22 MED ORDER — FUROSEMIDE 20 MG PO TABS
20.0000 mg | ORAL_TABLET | Freq: Two times a day (BID) | ORAL | 0 refills | Status: DC
  Filled 2020-11-22: qty 10, 5d supply, fill #0

## 2020-11-22 MED ORDER — NIFEDIPINE ER 30 MG PO TB24
30.0000 mg | ORAL_TABLET | Freq: Two times a day (BID) | ORAL | 0 refills | Status: AC
  Filled 2020-11-22: qty 60, 30d supply, fill #0

## 2020-11-22 MED ORDER — FUROSEMIDE 40 MG PO TABS
20.0000 mg | ORAL_TABLET | Freq: Two times a day (BID) | ORAL | Status: DC
  Administered 2020-11-22: 20 mg via ORAL
  Filled 2020-11-22: qty 1

## 2020-11-22 MED ORDER — IBUPROFEN 600 MG PO TABS
600.0000 mg | ORAL_TABLET | Freq: Four times a day (QID) | ORAL | 2 refills | Status: DC | PRN
  Filled 2020-11-22: qty 60, 15d supply, fill #0

## 2020-11-22 MED ORDER — SENNOSIDES-DOCUSATE SODIUM 8.6-50 MG PO TABS
1.0000 | ORAL_TABLET | Freq: Every evening | ORAL | 0 refills | Status: DC | PRN
  Filled 2020-11-22: qty 30, 15d supply, fill #0

## 2020-11-23 ENCOUNTER — Ambulatory Visit: Payer: Self-pay

## 2020-11-23 NOTE — Lactation Note (Signed)
This note was copied from a baby's chart.  NICU Lactation Consultation Note  Patient Name: Carolyn Rivas BTDHR'C Date: 11/23/2020 Age:20 hours   Subjective Reason for consult: Follow-up assessment Mother discharged yesterday and has not been pumping. She noticed fullness in her breasts today and initiated pumping again while I was present. I reinforced previously taught education on pumping, cleaning, and milk storage.   Objective Infant data: Mother's Current Feeding Choice: Breast Milk and Donor Milk  Infant feeding assessment Scale for Readiness: 2     Maternal data: [id number]  Vaginal, Spontaneous  Assessment Maternal: + breast changes today but no s/s of engorgement.   Intervention/Plan Interventions: Education  Plan: Consult Status: Follow-up  NICU Follow-up type: Verify onset of copious milk; Verify absence of engorgement Mother to pump q3   Elder Negus 11/23/2020, 4:19 PM

## 2020-11-24 ENCOUNTER — Ambulatory Visit: Payer: Self-pay

## 2020-11-24 NOTE — Lactation Note (Signed)
This note was copied from a baby's chart. Lactation Consultation Note  Patient Name: Carolyn Rivas Kathye Cipriani HYWVP'X Date: 11/24/2020 Reason for consult: Follow-up assessment;Late-preterm 34-36.6wks;Primapara;1st time breastfeeding;NICU baby;Infant < 6lbs;RN request Age:20 days  Visited with mom of 61 hours old LPI NICU female, RN Morrie Sheldon called LC for a feeding assist but once in the room, mom voiced she just wants to pump and bottle feed, she doesn't plan on putting baby to breast.  When LC started asking questions regarding pumping schedule, mom voiced she doesn't have a pump for home used, she was discharged yesterday. Offered a [facility] loaner since it's the Thanksgiving holiday, but she said she didn't have the money right now but will let NICU RN know if she changes her mind.  This LC faxed WIC referral to the [facility] office so they can assist mom with a pump once they re-open on Monday. Reviewed pumping schedule, lactogenesis II/III and how to convert her DEBP into a hand pump in the meantime.  Maternal Data  Mom's supply is WNL  Feeding Mother's Current Feeding Choice: Breast Milk and Donor Milk  Lactation Tools Discussed/Used Tools: Pump Breast pump type: Double-Electric Breast Pump Pump Education: Setup, frequency, and cleaning;Milk Storage Reason for Pumping: LPI in NICU Pumping frequency: 6 times/24 hours per mom, but she told LC she hasn't been pumping consistently because she doesn't have a pump for home use Pumped volume: 90 mL  Interventions Interventions: Breast feeding basics reviewed;DEBP;Education  Plan of care  Encouraged mom to start pumping consistently every 3 hours, at least 8 pumping sessions/24 hours She'll get on hold with the [facility] office to get a DEBP for home use  No other support person at this time. All questions and concerns answered, mom to call NICU LC PRN.  Discharge Pump: DEBP (no pump for home use) WIC Program: Yes  Consult Status Consult Status:  Follow-up Date: 11/24/20 Follow-up type: In-patient   Azekiel Cremer Venetia Constable 11/24/2020, 2:37 PM

## 2020-11-25 ENCOUNTER — Ambulatory Visit: Payer: Self-pay

## 2020-11-25 NOTE — Lactation Note (Signed)
This note was copied from a baby's chart.  NICU Lactation Consultation Note  Patient Name: Carolyn Rivas OHFGB'M Date: 11/25/2020 Age:20 days   Subjective Reason for consult: Follow-up assessment (loaner pump issuance) LC to room to issue loaner pump. Mom is aware to take pump kit home for use with loaner pump and to bring to hospital to use here. She is pumping without difficulty today.  Objective Infant data: Mother's Current Feeding Choice: Breast Milk  Infant feeding assessment Scale for Readiness: 2   Maternal data: G2P0111  Vaginal, Spontaneous   Pumping frequency: 6 times/24 hours per mom, but she told LC she hasn't been pumping consistently because she doesn't have a pump for home use Pumped volume: 90 mL  WIC Program: Yes WIC Referral Sent?: Yes Pump: WIC Loaner   Assessment Maternal: Milk volume: Normal   Intervention/Plan Interventions: Breast feeding basics reviewed; DEBP; Education  Tools: Pump  Plan: Consult Status: Follow-up  NICU Follow-up type: Weekly NICU follow up  Mother to return loaner pump within 2 weeks.   Gwynne Edinger 11/25/2020, 2:24 PM

## 2020-11-27 ENCOUNTER — Ambulatory Visit: Payer: Self-pay

## 2020-11-27 NOTE — Lactation Note (Signed)
This note was copied from a baby's chart. Lactation Consultation Note  Patient Name: Carolyn Rivas SEGBT'D Date: 11/27/2020 Reason for consult: Follow-up assessment;NICU baby;Infant < 6lbs;Primapara;1st time breastfeeding;Late-preterm 34-36.6wks;Mother's request Age:20 days  Visited with mom of 48 days old LPI NICU female, she requested lactation because her breast were hurting. When LC came in the room, mom voiced her breast felt better after she pumped.  She also mentioned that the [facility] loaner pump is "better" than the pump in baby's room. Showed mom how to switch from initiation to expression mode to increase the suction and power in the pump @ baby's room. Pump was working properly.   Provided mom with ice packs, no S/S of engorgement yet, but she mentioned that she still feels some "knots" on the bottom of her breasts. Reviewed engorgement prevention/treatment, supply/demand and lactogenesis II/III.  Maternal Data  Mom's supply seems to be WNL, but unsure of pumping frequency, she hasn't been pumping every 3 hours  Feeding Mother's Current Feeding Choice: Breast Milk and Donor Milk Nipple Type: Dr. Levert Feinstein Preemie  Lactation Tools Discussed/Used Tools: Pump Breast pump type: Double-Electric Breast Pump Pump Education: Setup, frequency, and cleaning;Milk Storage Reason for Pumping: LPI in NICU Pumping frequency: Unsure, mom gave a vague answer Pumped volume: 180 mL  Interventions Interventions: Breast feeding basics reviewed;DEBP;Education;Ice  Plan of care   Encouraged mom to start pumping consistently every 3 hours, at least 8 pumping sessions/24 hours She'll get on hold with the [facility] office to get a DEBP for home use tomorrow She'll ice her breast as needed    No other support person at this time. All questions and concerns answered, mom to call NICU LC PRN.  Discharge Discharge Education: Engorgement and breast care Pump: DEBP;WIC Loaner  Consult  Status Consult Status: Follow-up Date: 11/27/20 Follow-up type: In-patient   Lorella Gomez Venetia Constable 11/27/2020, 10:56 AM

## 2020-11-28 ENCOUNTER — Ambulatory Visit: Payer: Medicaid Other

## 2020-12-01 ENCOUNTER — Encounter: Payer: Self-pay | Admitting: Family Medicine

## 2020-12-03 ENCOUNTER — Ambulatory Visit: Payer: Self-pay

## 2020-12-03 ENCOUNTER — Telehealth (HOSPITAL_COMMUNITY): Payer: Self-pay

## 2020-12-03 ENCOUNTER — Telehealth (HOSPITAL_COMMUNITY): Payer: Self-pay | Admitting: *Deleted

## 2020-12-03 NOTE — Telephone Encounter (Signed)
Attempted hospital discharge follow-up call. Left message for patient to return RN call. Deforest Hoyles, RN, 12/03/20, [phone]

## 2020-12-03 NOTE — Lactation Note (Signed)
This note was copied from a baby's chart. Lactation Consultation Note  Patient Name: Carolyn Rivas SLHTD'S Date: 12/03/2020 Reason for consult: Follow-up assessment;NICU baby;Other (Comment);Infant < 6lbs;Late-preterm 34-36.6wks;Primapara;1st time breastfeeding (baby's discharge) Age:20 years  Visited with mom of 16 days old LPI NICU female, baby is going home today. Reviewed discharge education, mom is aware of the importance of pumping and "keeping the milk flowing" to protect her supply, she's going to be exclusively pumping and bottle feeding only.  She also had a question regarding the [facility] loaner pump, provided her with instructions to return pump to MAU and retrieve her deposit. She bought a DEBP from home use off [employer], she wasn't pleased with the hospital grade pump.  Maternal Data  Mom's supply has decreased probably due to inconsistent pumping  Feeding Mother's Current Feeding Choice: Breast Milk Nipple Type: Dr. Levert Feinstein Preemie  Lactation Tools Discussed/Used Tools: Pump Breast pump type: Double-Electric Breast Pump Pump Education: Setup, frequency, and cleaning;Milk Storage Reason for Pumping: LPI in NICU Pumping frequency: Unsure, mom didn't give a # of pumping sessions, she just mentioned she hasn't been pumping consistently Pumped volume: 80 mL  Interventions Interventions: Breast feeding basics reviewed;DEBP;Education  Plan of care   Encouraged mom to try pumping consistently every 3 hours, at least 8 pumping sessions/24 hours She'll return [facility] loaner pump to [facility]U today   No other support person at this time. All questions and concerns answered, mom to call NICU LC PRN.   Discharge Discharge Education: Engorgement and breast care Pump: DEBP;Personal (Mom bought a DEBP from home use, she didn't like the [facility] loaner pump)  Consult Status Consult Status: Complete Date: 12/03/20 Follow-up type: Call as needed   Natsha Guidry Venetia Constable 12/03/2020, 11:50  AM

## 2020-12-03 NOTE — Telephone Encounter (Signed)
"  I'm feeling pretty good." Patient has no questions or concerns about her healing.   Patient asks if we can call her back at a later time for EPDS.   Marcelino Duster [facility] 12/03/2020,1139

## 2020-12-05 ENCOUNTER — Encounter: Payer: Self-pay | Admitting: Family Medicine

## 2020-12-05 ENCOUNTER — Telehealth: Payer: Self-pay | Admitting: Family Medicine

## 2020-12-05 NOTE — Telephone Encounter (Signed)
Called patient as she had been >24h since logging a babyscripts blood pressure. Reports she is doing well, has just been very busy. When asked if she is taking her Nifedipine twice a day she states she is taking it every other day. I clarified that this is how her iron pills should be taken. I encouraged her to review her medications, take Nifedipine BID, and log blood pressure, she plans to do so a little later today.

## 2020-12-12 ENCOUNTER — Encounter: Payer: Self-pay | Admitting: Family Medicine

## 2020-12-22 ENCOUNTER — Telehealth: Payer: Self-pay | Admitting: Lactation Services

## 2020-12-22 ENCOUNTER — Encounter: Payer: Self-pay | Admitting: Family Medicine

## 2020-12-22 ENCOUNTER — Other Ambulatory Visit: Payer: Self-pay | Admitting: Lactation Services

## 2020-12-22 DIAGNOSIS — O99345 Other mental disorders complicating the puerperium: Secondary | ICD-10-CM

## 2020-12-22 NOTE — Telephone Encounter (Signed)
Called patient and was not able to reach her. LM for her to call the office and to check her My Chart message.

## 2020-12-28 ENCOUNTER — Ambulatory Visit: Payer: Medicaid Other | Admitting: Certified Nurse Midwife

## 2020-12-30 ENCOUNTER — Telehealth: Payer: Self-pay | Admitting: Licensed Clinical Social Worker

## 2020-12-30 ENCOUNTER — Encounter: Payer: Medicaid Other | Admitting: Licensed Clinical Social Worker

## 2020-12-30 NOTE — Telephone Encounter (Signed)
Called pt regarding scheduled mychart visit. Left message requesting callback  

## 2021-01-09 ENCOUNTER — Encounter: Payer: Self-pay | Admitting: Family Medicine

## 2021-01-10 ENCOUNTER — Telehealth (INDEPENDENT_AMBULATORY_CARE_PROVIDER_SITE_OTHER): Payer: Medicaid Other | Admitting: Family Medicine

## 2021-01-10 ENCOUNTER — Encounter: Payer: Self-pay | Admitting: Family Medicine

## 2021-01-10 DIAGNOSIS — F53 Postpartum depression: Secondary | ICD-10-CM | POA: Diagnosis not present

## 2021-01-10 DIAGNOSIS — O9081 Anemia of the puerperium: Secondary | ICD-10-CM | POA: Diagnosis not present

## 2021-01-10 DIAGNOSIS — O165 Unspecified maternal hypertension, complicating the puerperium: Secondary | ICD-10-CM

## 2021-01-10 DIAGNOSIS — O10919 Unspecified pre-existing hypertension complicating pregnancy, unspecified trimester: Secondary | ICD-10-CM

## 2021-01-10 DIAGNOSIS — Z8759 Personal history of other complications of pregnancy, childbirth and the puerperium: Secondary | ICD-10-CM | POA: Diagnosis not present

## 2021-01-10 DIAGNOSIS — Z3480 Encounter for supervision of other normal pregnancy, unspecified trimester: Secondary | ICD-10-CM

## 2021-01-10 NOTE — Progress Notes (Signed)
Provider location: [facility] for [facility] [facility]   Patient location: Home  I connected with Carolyn Rivas on 01/10/21 at  3:55 PM EST by Mychart Video Encounter and verified that I am speaking with the correct person using two identifiers.       I discussed the limitations, risks, security and privacy concerns of performing an evaluation and management service virtually and the availability of in person appointments. I also discussed with the patient that there may be a patient responsible charge related to this service. The patient expressed understanding and agreed to proceed.  Post Partum Visit Note Subjective:   Carolyn Rivas is a 21 y.o. [phone] female who presents for a postpartum visit. She is 7 weeks postpartum following a normal spontaneous vaginal delivery.  I have fully reviewed the prenatal and intrapartum course. The delivery was at 34.1 gestational weeks.  Anesthesia: epidural. Postpartum course has been uneventful. Baby is doing well. Baby is feeding by bottle - Gerber Gentle . Bleeding no bleeding. Bowel function is normal. Bladder function is normal. Patient is not sexually active. Contraception method is Depo-Provera injections. Postpartum depression screening: positive.   The pregnancy intention screening data noted above was reviewed. Potential methods of contraception were discussed. The patient elected to proceed with No data recorded.   Edinburgh Postnatal Depression Scale - 01/10/21 1609       Edinburgh Postnatal Depression Scale:  In the Past 7 Days   I have been able to laugh and see the funny side of things. 1    I have looked forward with enjoyment to things. 3    I have blamed myself unnecessarily when things went wrong. 2    I have been anxious or worried for no good reason. 0    I have felt scared or panicky for no good reason. 0    Things have been getting on top of me. 1    I have been so unhappy that I have  had difficulty sleeping. 3    I have felt sad or miserable. 3    I have been so unhappy that I have been crying. 3    The thought of harming myself has occurred to me. 0    Edinburgh Postnatal Depression Scale Total 16             The following portions of the patient's history were reviewed and updated as appropriate: allergies, current medications, past family history, past medical history, past social history, past surgical history, and problem list.  Review of Systems Pertinent items noted in HPI and remainder of comprehensive ROS otherwise negative.  Objective:  LMP 03/26/2020     General:  Alert, oriented and cooperative. Patient is in no acute distress.  Respiratory: Normal respiratory effort, no problems with respiration noted  Mental Status: Normal mood and affect. Normal behavior. Normal judgment and thought content.  Rest of physical exam deferred due to type of encounter   Assessment:    Normal postpartum exam.  Plan:  Essential components of care per ACOG recommendations:  1.  Mood and well being: Patient with positive depression screening today. Reviewed local resources for support. Referred to [facility]. - Patient does not use tobacco.  - hx of drug use? No    2. Infant care and feeding:  -Patient currently breastmilk feeding? No  -Social determinants of health (SDOH) reviewed in EPIC. The following needs were identified: transportation  3. Sexuality, contraception and birth spacing -  Patient does not want a pregnancy in the next year..  - Reviewed forms of contraception in tiered fashion. Patient desired Depo-Provera today.   - Discussed birth spacing of 18 months  4. Sleep and fatigue -Encouraged family/partner/community support of 4 hrs of uninterrupted sleep to help with mood and fatigue  5. Physical Recovery  - Discussed patients delivery and complications - Patient had no lacerations - Patient has urinary incontinence? No - Patient is safe to resume  physical and sexual activity  6.  Health Maintenance - Last pap smear done n/a   7. Chronic Disease- HTN - has not checked BP or taken any BP medicine for months. Had severe PreE during labor. Asked her to check BP and send [place] a mychart message.  - PCP follow up - Patient requesting work accommodations and initially stated she needed them by tomorrow. Needs accomodations to take care of her child while she works from home. We discussed that a one day turn around time is often not possible. Asked her to send paperwork and discussed that we have a paperwork policy, standard is 7-10 business days, there is a nominal fee. After much discussion she will try to ask her employer for some more time for [place] to complete the paperwork.  I will send a message to help expedite the process.   I provided 15 minutes of face-to-face time during this encounter.    Return in about 4 weeks (around 02/07/2021) for depo shot.  No future appointments.  Venora Maples, MD Center for [facility], [facility]   Length of accomodation: 02/28/21

## 2021-02-14 ENCOUNTER — Ambulatory Visit: Payer: Medicaid Other

## 2021-04-19 ENCOUNTER — Other Ambulatory Visit: Payer: Self-pay

## 2021-04-19 ENCOUNTER — Encounter: Payer: Self-pay | Admitting: Obstetrics & Gynecology

## 2021-04-19 DIAGNOSIS — Z0000 Encounter for general adult medical examination without abnormal findings: Secondary | ICD-10-CM

## 2021-04-19 DIAGNOSIS — I10 Essential (primary) hypertension: Secondary | ICD-10-CM

## 2022-02-16 ENCOUNTER — Other Ambulatory Visit (HOSPITAL_COMMUNITY): Payer: Self-pay

## 2022-08-08 ENCOUNTER — Ambulatory Visit: Payer: Medicaid Other | Admitting: Obstetrics and Gynecology

## 2023-06-02 ENCOUNTER — Emergency Department (HOSPITAL_COMMUNITY)
Admission: EM | Admit: 2023-06-02 | Discharge: 2023-06-02 | Disposition: A | Discharge status: Against Medical Advice | Attending: Emergency Medicine | Admitting: Emergency Medicine

## 2023-06-02 DIAGNOSIS — M79673 Pain in unspecified foot: Secondary | ICD-10-CM | POA: Insufficient documentation

## 2023-06-02 DIAGNOSIS — Z5321 Procedure and treatment not carried out due to patient leaving prior to being seen by health care provider: Secondary | ICD-10-CM | POA: Diagnosis not present

## 2023-06-14 ENCOUNTER — Ambulatory Visit: Admitting: Family Medicine

## 2023-06-18 ENCOUNTER — Ambulatory Visit: Admitting: Family Medicine
